# Patient Record
Sex: Male | Born: 1950
Health system: Southern US, Community
[De-identification: ages and names within clinical notes are randomized; demographics above are authoritative.]

## PROBLEM LIST (undated history)

## (undated) DIAGNOSIS — D869 Sarcoidosis, unspecified: Secondary | ICD-10-CM

## (undated) DIAGNOSIS — Z973 Presence of spectacles and contact lenses: Secondary | ICD-10-CM

## (undated) DIAGNOSIS — Z8719 Personal history of other diseases of the digestive system: Secondary | ICD-10-CM

## (undated) DIAGNOSIS — C4491 Basal cell carcinoma of skin, unspecified: Secondary | ICD-10-CM

## (undated) DIAGNOSIS — C61 Malignant neoplasm of prostate: Secondary | ICD-10-CM

## (undated) DIAGNOSIS — J45909 Unspecified asthma, uncomplicated: Secondary | ICD-10-CM

## (undated) DIAGNOSIS — M1990 Unspecified osteoarthritis, unspecified site: Secondary | ICD-10-CM

## (undated) DIAGNOSIS — D229 Melanocytic nevi, unspecified: Secondary | ICD-10-CM

## (undated) DIAGNOSIS — C4492 Squamous cell carcinoma of skin, unspecified: Secondary | ICD-10-CM

## (undated) DIAGNOSIS — I82409 Acute embolism and thrombosis of unspecified deep veins of unspecified lower extremity: Secondary | ICD-10-CM

## (undated) HISTORY — PX: PROSTATE BIOPSY: SHX241

## (undated) HISTORY — DX: Squamous cell carcinoma of skin, unspecified: C44.92

## (undated) HISTORY — DX: Basal cell carcinoma of skin, unspecified: C44.91

## (undated) HISTORY — DX: Melanocytic nevi, unspecified: D22.9

---

## 2001-11-27 ENCOUNTER — Encounter (INDEPENDENT_AMBULATORY_CARE_PROVIDER_SITE_OTHER): Payer: Self-pay | Admitting: Specialist

## 2001-11-27 ENCOUNTER — Ambulatory Visit (HOSPITAL_COMMUNITY): Admission: RE | Admit: 2001-11-27 | Discharge: 2001-11-27 | Payer: Self-pay | Admitting: Gastroenterology

## 2005-05-03 DIAGNOSIS — D229 Melanocytic nevi, unspecified: Secondary | ICD-10-CM

## 2005-05-03 DIAGNOSIS — C4491 Basal cell carcinoma of skin, unspecified: Secondary | ICD-10-CM

## 2005-05-03 HISTORY — DX: Basal cell carcinoma of skin, unspecified: C44.91

## 2005-05-03 HISTORY — DX: Melanocytic nevi, unspecified: D22.9

## 2006-03-13 ENCOUNTER — Ambulatory Visit: Payer: Self-pay | Admitting: Internal Medicine

## 2006-04-01 ENCOUNTER — Ambulatory Visit: Payer: Self-pay | Admitting: Internal Medicine

## 2006-04-09 ENCOUNTER — Ambulatory Visit: Payer: Self-pay | Admitting: Internal Medicine

## 2006-04-09 ENCOUNTER — Encounter (INDEPENDENT_AMBULATORY_CARE_PROVIDER_SITE_OTHER): Payer: Self-pay | Admitting: Specialist

## 2006-04-09 ENCOUNTER — Ambulatory Visit: Admission: RE | Admit: 2006-04-09 | Discharge: 2006-04-09 | Payer: Self-pay | Admitting: Internal Medicine

## 2007-04-02 DIAGNOSIS — T7840XA Allergy, unspecified, initial encounter: Secondary | ICD-10-CM | POA: Insufficient documentation

## 2007-04-02 DIAGNOSIS — J45909 Unspecified asthma, uncomplicated: Secondary | ICD-10-CM | POA: Insufficient documentation

## 2007-05-12 ENCOUNTER — Ambulatory Visit: Payer: Self-pay | Admitting: Internal Medicine

## 2007-05-12 DIAGNOSIS — D869 Sarcoidosis, unspecified: Secondary | ICD-10-CM | POA: Insufficient documentation

## 2007-11-24 ENCOUNTER — Ambulatory Visit: Payer: Self-pay | Admitting: Internal Medicine

## 2008-05-14 HISTORY — PX: OTHER SURGICAL HISTORY: SHX169

## 2008-12-12 DIAGNOSIS — I82409 Acute embolism and thrombosis of unspecified deep veins of unspecified lower extremity: Secondary | ICD-10-CM

## 2008-12-12 HISTORY — DX: Acute embolism and thrombosis of unspecified deep veins of unspecified lower extremity: I82.409

## 2008-12-17 ENCOUNTER — Ambulatory Visit (HOSPITAL_BASED_OUTPATIENT_CLINIC_OR_DEPARTMENT_OTHER): Admission: RE | Admit: 2008-12-17 | Discharge: 2008-12-17 | Payer: Self-pay | Admitting: Orthopedic Surgery

## 2008-12-31 ENCOUNTER — Ambulatory Visit (HOSPITAL_COMMUNITY): Admission: RE | Admit: 2008-12-31 | Discharge: 2008-12-31 | Payer: Self-pay | Admitting: Family Medicine

## 2008-12-31 ENCOUNTER — Inpatient Hospital Stay (HOSPITAL_COMMUNITY): Admission: EM | Admit: 2008-12-31 | Discharge: 2009-01-03 | Payer: Self-pay | Admitting: Emergency Medicine

## 2008-12-31 ENCOUNTER — Encounter (INDEPENDENT_AMBULATORY_CARE_PROVIDER_SITE_OTHER): Payer: Self-pay | Admitting: Family Medicine

## 2008-12-31 ENCOUNTER — Ambulatory Visit: Payer: Self-pay | Admitting: Vascular Surgery

## 2010-06-12 ENCOUNTER — Other Ambulatory Visit: Payer: Self-pay | Admitting: Dermatology

## 2010-08-19 LAB — DIFFERENTIAL
Lymphocytes Relative: 9 % — ABNORMAL LOW (ref 12–46)
Lymphs Abs: 1.2 10*3/uL (ref 0.7–4.0)
Monocytes Relative: 6 % (ref 3–12)
Neutro Abs: 11.7 10*3/uL — ABNORMAL HIGH (ref 1.7–7.7)
Neutrophils Relative %: 85 % — ABNORMAL HIGH (ref 43–77)

## 2010-08-19 LAB — BASIC METABOLIC PANEL
BUN: 23 mg/dL (ref 6–23)
BUN: 24 mg/dL — ABNORMAL HIGH (ref 6–23)
CO2: 26 mEq/L (ref 19–32)
CO2: 29 mEq/L (ref 19–32)
Calcium: 8.9 mg/dL (ref 8.4–10.5)
Chloride: 106 mEq/L (ref 96–112)
Creatinine, Ser: 0.9 mg/dL (ref 0.4–1.5)
Creatinine, Ser: 1.04 mg/dL (ref 0.4–1.5)
GFR calc Af Amer: >60 mL/min (ref >60)
GFR calc non Af Amer: >60 mL/min (ref >60)
Glucose, Bld: 106 mg/dL — ABNORMAL HIGH (ref 70–99)
Potassium: 4.2 mEq/L (ref 3.5–5.1)
Sodium: 142 mEq/L (ref 135–145)

## 2010-08-19 LAB — COMPREHENSIVE METABOLIC PANEL
ALT: 19 U/L (ref 0–53)
Albumin: 3.5 g/dL (ref 3.5–5.2)
Calcium: 9 mg/dL (ref 8.4–10.5)
GFR calc Af Amer: >60 mL/min (ref >60)
Glucose, Bld: 177 mg/dL — ABNORMAL HIGH (ref 70–99)
Sodium: 140 mEq/L (ref 135–145)
Total Protein: 6.3 g/dL (ref 6.0–8.3)

## 2010-08-19 LAB — CBC
HCT: 37.3 % — ABNORMAL LOW (ref 39.0–52.0)
HCT: 38.4 % — ABNORMAL LOW (ref 39.0–52.0)
Hemoglobin: 12.9 g/dL — ABNORMAL LOW (ref 13.0–17.0)
MCHC: 34.3 g/dL (ref 30.0–36.0)
MCHC: 34.5 g/dL (ref 30.0–36.0)
MCHC: 34.7 g/dL (ref 30.0–36.0)
MCV: 95.2 fL (ref 78.0–100.0)
MCV: 95.7 fL (ref 78.0–100.0)
Platelets: 223 10*3/uL (ref 150–400)
Platelets: 238 10*3/uL (ref 150–400)
Platelets: 239 10*3/uL (ref 150–400)
RBC: 4.3 MIL/uL (ref 4.22–5.81)
RDW: 12.9 % (ref 11.5–15.5)
WBC: 10.5 10*3/uL (ref 4.0–10.5)
WBC: 13.7 10*3/uL — ABNORMAL HIGH (ref 4.0–10.5)

## 2010-08-19 LAB — PROTIME-INR
INR: 1 (ref 0.00–1.49)
Prothrombin Time: 13 seconds (ref 11.6–15.2)
Prothrombin Time: 14.1 seconds (ref 11.6–15.2)

## 2010-08-19 LAB — APTT: aPTT: 24 seconds (ref 24–37)

## 2010-09-26 NOTE — H&P (Signed)
[name], [name]                  ACCOUNT NO.:  [id number]   MEDICAL RECORD NO.:  [id number]          PATIENT TYPE:  OUT   LOCATION:  VASC                         FACILITY:  [facility]   PHYSICIAN:  Donalynn Furlong, MD      DATE OF BIRTH:  20-May-1950   DATE OF ADMISSION:  12/31/2008  DATE OF DISCHARGE:                              HISTORY & PHYSICAL   PRIMARY CARE Bennie Chirico:  Dr. Gildardo Cranker.   ADMITTING TEAM:  [facility] red [facility].   CHIEF COMPLAINT:  Right lower extremity pain and swelling.   HISTORY OF PRESENT ILLNESS:  [name] is a 60 year old Caucasian male  who lives in [place] with his wife.  He presented to [facility] ED  tonight for evaluation of his right lower extremity pain and swelling.  He had Jones fracture on the right fifth metatarsal 2 weeks ago.  He  underwent open reduction internal fixation of that fracture with a  screw, by orthopedic surgeon Dr. Jodi Geralds at [facility].  He  was recovering well from the fracture; and he started having swelling  and pain in the right lower extremity; more in the calf area, which  progressed to involve his right thigh also recently.  Because of his  swelling and pain without redness, he decided to visit the orthopedic  surgeon.  He saw Dr. Althea Charon in the orthopedic office; they referred  him to the ER for a Doppler ultrasound.  It shows right lower extremity  DVT in proximal femoral vein, calf vein and popliteal vein.  The patient  denies any other symptoms.  The patient denies any history of clots in  the past.  The patient denies any family history of clots in the past.   PAST MEDICAL HISTORY:  1. Sarcoidosis.  2. Colon polyp.   PAST SURGICAL HISTORY:  None.   SOCIAL HISTORY:  No smoking or drug use; has occasional alcohol use.  He  lives with his wife in [place].   FAMILY HISTORY:  Nothing significant.   HOME MEDICATION LIST:  Symbicort inhaler.   ALLERGIES:  None.   REVIEW OF SYSTEMS:  Positive, as per  HPI, otherwise negative.  Review of  systems done as per 14 system.   PHYSICAL EXAMINATION:  VITAL SIGNS:  Blood pressure 132/77, pulse 94,  respirations 18, temperature 98.2, oxygen saturation 97% on room air.  GENERAL:  Alert, oriented x3; laying in bed without any acute distress.  CARDIOVASCULAR:  S1 and S2 regular.  No murmur, rub or gallop.  LUNGS:  Clear to auscultation bilaterally.  No wheezing, rhonchi,  crackles.  ABDOMEN:  Nontender, nondistended.  Bowel sounds present.  EXTREMITIES:  No clubbing, cyanosis or edema.  Peripheral pulses felt in  all 4 extremities.  NECK:  No thyromegaly or JVD.  SKIN:  No rash or bruits.  NEUROLOGICAL:  Exam shows intact cranial nerves, muscular strength,  sensation and reflexes.  HEENT:  HEAD:  Normocephalic nontraumatic.  EYES:  Pupils equally  reactive to light and accommodation.  Extraocular muscles intact.  Oral  cavity:  Oral mucosa  moist.  No thrush noted.  NECK:  No thyromegaly or JVD.   LABS:  Shows a right lower extremity Doppler reveals DVT proximal  femoral in calf vein and popliteal vein.  BMP unremarkable, except BUN  19, creatinine 24.  PT/PTT, and INR within normal limits.  CBC with  differential shows:  WBC 13.7, otherwise unremarkable.   ASSESSMENT AND PLAN:  1. Right lower extremity deep vein thrombosis in proximal femoral      vein, popliteal vein and calf vein:  Status post immobilization      from recent right fifth metatarsal fracture surgery.  2. History of sarcoidosis.  3. History of colon polyp.   PLAN:  Will admit the patient on a telemetry bed under the [facility] red  team, with a diagnosis of deep vein thrombosis of right lower extremity.  Will check CBC, CMP, PT/PTT, INR in the morning.  Will check EKG now.  Will check vital signs every 8 hours.  Will provide a regular diet.  Will start normal saline at 50 mL per hour for one day.  Will start  Coumadin today as per pharmacy dosing.  The patient has been  counseled  about the use of Coumadin, including side effects and drug monitoring.  The patient will start on Lovenox 1 mg/kg subcutaneously q. 24 hours to  allow therapeutic Coumadin level. Will provide Tylenol, Phenergan,  Ativan and Ambien p.r.n. for symptomatic control.  Further workup  according to the planned pending.      Donalynn Furlong, MD  Electronically Signed     TVP/MEDQ  D:  12/31/2008  T:  12/31/2008  Job:  [id number]   cc:   Harvie Junior, M.D.  Dr. Gildardo Cranker

## 2010-09-26 NOTE — Discharge Summary (Signed)
[name], [name]                  ACCOUNT NO.:  [id number]   MEDICAL RECORD NO.:  [id number]          PATIENT TYPE:  INP   LOCATION:  5508                         FACILITY:  [facility]   PHYSICIAN:  Kela Millin, M.D.DATE OF BIRTH:  November 09, 1950   DATE OF ADMISSION:  12/31/2008  DATE OF DISCHARGE:  01/03/2009                               DISCHARGE SUMMARY   DISCHARGE DIAGNOSES:  1. Right lower extremity deep vein thrombosis.  2. Status post fifth metatarsal fracture surgery - and status post      immobilization.  3. History of sarcoidosis.  4. History of colon polyps.   PROCEDURES AND STUDIES:  Doppler ultrasound of lower extremities -  mobile DVT noted in the proximal femoral vein.  Also a DVT in the calf  vein extending through the popliteal vein.   BRIEF HISTORY:  The patient is a pleasant 60 year old white male who  presented with right lower extremity pain and swelling.  It was noted  that he had had a fracture on the right fifth metatarsal 2 weeks ago and  underwent open reduction internal fixation of that fracture and a screw  was placed by Dr. Jodi Geralds.  He was recovering well from the fracture  but developed swelling and pain in his calf area which progressed to a  to involve his thigh as well, and so he went to see his orthopedic  doctor.  He was seen at their office and was sent to have a Doppler  ultrasound in the ER and the results are as stated above.  Following  these findings, he was admitted for further evaluation and management.   Please see the full admission history and physical dictated on December 31, 2008 by Dr. Allena Katz for the details of the admission physical exam as  well as the laboratory data.   HOSPITAL COURSE:  1. Right leg deep vein thrombosis - upon admission the patient was      started on Lovenox and Coumadin for anticoagulation.  His PT/INR      was monitored daily.  The impression was that his recent metatarsal      surgery with  immobilization following that was what put him at risk      for the DVT and it was also noted that he does have a history of      sarcoidosis.  Given the ultrasound report that he had a mobile clot      in his proximal femoral vein, I discussed the patient with      hematology and they recommended initially monitoring him while he      was getting anticoagulation for at least 24 hours in house and that      if he was reliable and would be compliant with his Lovenox and      Coumadin, to then discharge him following that on the Lovenox and      Coumadin and have him follow up outpatient.  He has remained      hemodynamically stable and the chest pain free.  His hemoglobin has  also remained stable - hemoglobin of 13.4 today prior to discharge.      The patient also received Coumadin and Lovenox teaching and he is      able to administer his own Lovenox shots.  Hematology recommended      for him to limit his activity to a bare minimum until his INR is      therapeutic and then he can slowly increase his activity.  I      discussed this with the patient and his wife and the wife stated      that she will ensure that his activity is limited.  The patient      also agrees to comply with this.  His INR has been monitored daily      and it is still 1.1 today prior to discharge.  He will be given  mg      dose of Coumadin today and will be discharged on 10 mg daily until      his PT/INR is rechecked on January 05, 2009 at 11:45 a.m. in Dr.      Charlott Rakes office.  He has not had any evidence of bleeding.  He will      be discharged at this time and he is to follow up with Dr. Tenny Craw as      already indicated, and once his INR is therapeutic - 2 to 3 goal,      the Lovenox will be discontinued by his primary care physician.      Given that his recent metatarsal surgery was the main risk factor      for his DVT, will recommended anticoagulation for 3-6 months or as      determined per his primary care  physician on followup.  2. Recent fifth metatarsal surgery - the patient was seen by      orthopedics this hospital stay and instructed on his activity level      and he is to follow up at Dr. Luiz Blare' office in 2 weeks.  3. History of sarcoidosis - stable.  He is to continue his Symbicort      and follow up with his pulmonologist as scheduled.   DISCHARGE MEDICATIONS:  1. Lovenox 90 mg subcu q.12 hours until his INR is therapeutic and      then to be discontinued per PCP.  2. Coumadin 10 mg 1 p.o. daily or as directed per his PCP.  3. Symbicort as previously.  4. The patient is to avoid anti-inflammatories/NSAIDS.   FOLLOW UP CARE:  1. Dr. Duane Lope on January 05, 2009 at 11:45 and his office number is      [phone].  2. Dr. Jodi Geralds in 2 weeks.  The patient to call for appointment.   DISCHARGE CONDITION:  Improved and Stable.      Kela Millin, M.D.  Electronically Signed     ACV/MEDQ  D:  01/03/2009  T:  01/03/2009  Job:  [id number]   cc:   C. Duane Lope, M.D.  Harvie Junior, M.D.

## 2010-09-26 NOTE — Op Note (Signed)
NAMEXAI, FRERKING                  ACCOUNT NO.:  [id number]   MEDICAL RECORD NO.:  [id number]          PATIENT TYPE:  AMB   LOCATION:  DSC                          FACILITY:  [facility]   PHYSICIAN:  Harvie Junior, M.D.   DATE OF BIRTH:  1951-01-18   DATE OF PROCEDURE:  12/17/2008  DATE OF DISCHARGE:                               OPERATIVE REPORT   PREOPERATIVE DIAGNOSIS:  True Jones fracture, right fifth metatarsal.   POSTOPERATIVE DIAGNOSIS:  True Jones fracture, right fifth metatarsal.   PRINCIPAL PROCEDURE:  Open reduction and internal fixation of right  fifth metatarsal fracture with a 6.5 cannulated screw, 55 mm with 16-mm  of terminal thread.   SURGEON:  Harvie Junior, MD   ASSISTANT:  Marshia Ly, PA   ANESTHESIA:  General.   BRIEF HISTORY:  Mr. Shedden is a 60 year old male with long history of  having had significant injury to his right foot.  He had been treated  conservatively for a period time.  He had a secondary injury to his foot  and had a much greater pain and because of continued complaints of pain  at that point, he was taken to the operating room for open reduction and  internal fixation of his fifth metatarsal fracture.   PROCEDURE:  The patient was taken to the operating room.  After adequate  anesthesia was obtained with general anesthetic, the patient was placed  supine on the operating table and moved in the left lateral decubitus  position.  All bony prominences were well padded.  Attention was turned  to right foot where initial markings were made under fluoro imaging and  then we put a 4.5 guidewire down the central portion of the canal, over  reamed with a 3.2 drill, took a 4.5 tap down, really did not get  tremendous purchase and at that point, I exchanged 4.5 tap for a 5.0  guidewire and a 5.0 drill distal to the proximal piece, took a 6.5,  tapped down, and got excellent purchase down about 50 mm.  At that  point, we felt that a 55-mm screw would  be great.  We then measured to  see if we get 32 mm of threat past the fracture site, but we were really  could not with a short enough screw.  So, at that point, we went with a  55-mm screw 16-mm thread and over the guidewire advanced down.  Prior to  advancing it under fluoro imaging, we took a curette and curetted the  fracture site aggressively to stimulate bone healing.  Once this was  completed, the 55-mm screw was advanced down under direct imaging under  fluoro.  We got wonderful purchase and we were able to get just dramatic  compression across the fracture site where we were able to see the  fracture site  compressed down.  At that point, the guidewire was removed.  Wound was  irrigated and closed.  Sterile compressive dressing was applied as well  as a posterior splint.  The patient was taken to recovery room and was  noted to be in satisfactory condition.  Estimated blood loss for the  procedure was none.      Harvie Junior, M.D.  Electronically Signed     JLG/MEDQ  D:  12/17/2008  T:  12/17/2008  Job:  [id number]

## 2010-09-29 NOTE — Assessment & Plan Note (Signed)
[facility]                               PULMONARY OFFICE NOTE   NAME:[name], Juan                           MRN:          [id number]  DATE:04/01/2006                            DOB:          [birth year]    HISTORY:  This is a 60 year old white male with new onset of dry cough and  dyspnea that seemed to have improve transiently with albuterol, but was  associated on chest x-ray with significant adenopathy. I saw him on March 13, 2006 with an ACE level of 51 and returns here today for a follow up  plain chest x-ray. In the meantime he has developed significant sweats but  no rigors or purulent sputum, sore throat, chest pain, ocular or articular  complaints or skin rash.   Since his previous visit he was started on Symbicort 80-4.5 two puffs b.i.d.  and had complete elimination of all of his symptoms and is no longer  requiring any albuterol.   PHYSICAL EXAMINATION:  GENERAL: He is a pleasant, ambulatory white man in no  acute distress.  VITAL SIGNS:  Afebrile.  HEENT: Unremarkable. Pharynx is clear.  NECK: Supple without cervical adenopathy or tenderness. Trachea is midline.  LUNG FIELDS: Are perfectly clear bilaterally to auscultation and percussion.  HEART: Regular rhythm without murmurs, rubs, or gallops.  ABDOMEN: Soft, benign.  EXTREMITIES:  Warm without calf tenderness.   Chest x-ray reveals massive mediastinal hilar adenopathy.   IMPRESSION:  Marked improvement in symptoms of coughing and dyspnea after  Symbicort 80-4.5 two puffs b.i.d. was added. This of course does not  indicate a diagnosis as many different forms of airways reactivity would  improve with Symbicort, including related to sarcoid and possibly  lymphomatous inflammation of the airway.  I am encouraged though based on  his benign clinical course, that sarcoid appears much more likely than  lymphoma but am concerned based on the size of the lymph nodes.   I therefore  am proceeding directly to bronchoscopy with transbronchial  bronchoscopy and he agreed to the procedure after full discussion of risks,  benefits and alternatives.     Charlaine Dalton. Sherene Sires, MD, [facility]  Electronically Signed    MBW/MedQ  DD: 04/01/2006  DT: 04/01/2006  Job #: [id number]   cc:   Dellis Anes. Idell Pickles, M.D.

## 2010-09-29 NOTE — Assessment & Plan Note (Signed)
[facility]                               PULMONARY OFFICE NOTE   KASHMERE, STAFFA                           MRN:          [id number]  DATE:03/13/2006                            DOB:          1950-09-06    REFERRING PHYSICIAN:  Dellis Anes. Idell Pickles, M.D.   PULMONARY CONSULTATION:   CHIEF COMPLAINT:  Dyspnea and cough.   HISTORY:  A 60 year old white male with a history of seasonal rhinitis going  back to his teenage years, typically worse in the fall than the spring  associated with mostly nasal congestion, sneezing and watery rhinitis with  minimal need for albuterol over the last several years. However, in the last  6 weeks he has had a different pattern of cough associated with dyspnea that  initially occurred while he was in [place] on work and has persisted on a  daily basis since then.  He says he states he is about 50% better after  using albuterol and actually comes to the office today after having used  albuterol 3 hours ago with good control of his coughing.  He says he has  no nocturnal exacerbation to coughing, fever, chills, sweats, orthopnea,  PND, leg swelling, itching, sneezing or subjective wheezing at present.   PAST MEDICAL HISTORY:  Is significant for the absence of additional  complaints.   Allergies none known.   MEDICATIONS:  Allegra D one daily as needed.   SOCIAL HISTORY:  He never smoked.  He works as a Fish farm manager, travels  extensively.   FAMILY HISTORY:  Is recorded in detail, significant for the absence of HB,  sarcoid or lymphoma.   REVIEW OF SYSTEMS:  Taken in detail on the worksheet.  Significant for  absence of weight loss, fever, chills, sweats, ocular complaints.  He has  occasional ankle discomfort but can control this with nonsteroidals as  needed.   PHYSICAL EXAMINATION:  This is a pleasant ambulatory white male in no acute  distress.  VITAL SIGNS:  Normal.  HEENT:  Unremarkable.  Pharynx is clear.  LUNGS:  Lung fields are completely clear by auscultation and percussion.  HEART:  There is a regular rate and rhythm without murmur, gallop or rub.  ABDOMEN:  Soft, benign.  EXTREMITIES:  Warm without calf tenderness, cyanosis, clubbing or edema.   CT scan was available from [facility] and indicates extensive mediastinal  hilar adenopathy.   IMPRESSION:  1. His new onset cough and dyspnea suggesting possible asthma since he      responds fairly well to Albuterol.  Because he is using so much      albuterol I will switch him to Symbicort 80/4.5 two puffs b.i.d.  2. A mediastinal hilar adenopathy of unclear etiology and also unclear      chronicity.  Apparently there are old x-rays available that do not show      this and so what I would like to do is to have the patient return in 2      weeks to have a follow up chest x-ray so we can  put all the cards on      the table in terms of developing chronicity of present illness      radiographically.  If indeed there is new adenopathy, the differential      diagnosis would be most likely sarcoid but unfortunately would include      a lymphoma given the relatively late onset in age of this patient and      non-African-American status.  3. Today I recommended a profile looking for any evidence of systemic      sarcoid activity which would include a CMET for LFTs and also sed rate      and ACE levels.    ______________________________  Charlaine Dalton Sherene Sires, MD, [facility]    MBW/MedQ  DD: 03/13/2006  DT: 03/14/2006  Job #: [id number]   cc:   Dellis Anes. Idell Pickles, M.D.

## 2010-09-29 NOTE — Op Note (Signed)
[name], Juan                  ACCOUNT NO.:  [id number]   MEDICAL RECORD NO.:  [id number]          PATIENT TYPE:  AMB   LOCATION:  CARD                         FACILITY:  [facility]   PHYSICIAN:  Casimiro Needle B. Sherene Sires, MD, FCCPDATE OF BIRTH:  Jul 17, 1950   DATE OF PROCEDURE:  04/09/2006  DATE OF DISCHARGE:                               OPERATIVE REPORT   PROCEDURE:  Fiberoptic bronchoscopy with a transbronchial biopsy of the  right lower lobe.   HISTORY AND INDICATIONS:  Please see dictated pulmonary consultation  notes in the E-Chart record.  The patient agreed to the procedure after  a full discussion of the risks, benefits and alternatives.   The procedure was performed in the bronchoscopy suite with continuous  monitoring by surface ECG and oximetry.  The patient maintained adequate  saturation throughout the procedure in sinus rhythm.  He received a  total of 100 mg of IV Demerol and 5 mg IV Versed for adequate sedation  and cough suppression.   The right and left nares were initially prepared with 1% lidocaine by  updraft nebulizer and then the right left naris was additionally  prepared with 2% lidocaine jelly.   Using a standard flexible fiberoptic bronchoscope, the left naris was  easily cannulated with good visualization of the entire pharynx and  larynx.  The cords moved normally and there were no apparent upper  airway lesions.   Using additional 1% lidocaine as needed, the entire tracheobronchial  tree was explored bilaterally with the following findings:  The trachea,  carina and all the major airways opened widely to the subsegmental  level.  There were no endobronchial lesions, and the mucosa appeared  normal throughout.   PROCEDURE:  Using a wedge position in several of the basal segments of  the right lower lobe, random transbronchial biopsies were obtained x6  for transbronchial for histology using fluoroscopic guidance.  In  addition, the right middle lobe was  lavaged for AFB and fungal stain and  culture.   The patient tolerated the procedure well with a follow-up chest x-ray  pending at the time of dictation.   IMPRESSION:  Extensive mediastinal and hilar adenopathy consistent with  sarcoid versus lymphoma.  There should be adequate tissue obtained by  transbronchial biopsies to exclude sarcoid, with the next logical step  in the workup being proceeding with mediastinoscopy if the diagnosis is  still in doubt after the above studies are available.      Charlaine Dalton. Sherene Sires, MD, [facility]  Electronically Signed    MBW/MEDQ  D:  04/09/2006  T:  04/09/2006  Job:  [id number]   cc:   Dellis Anes. Idell Pickles, M.D.  Fax: [phone]

## 2013-05-29 NOTE — Progress Notes (Signed)
Surgery scheduled for 06/24/13.  preop on 06/04/13 at 0830am.  Need orders in EPIC.  Thank You.

## 2013-06-02 ENCOUNTER — Other Ambulatory Visit: Payer: Self-pay | Admitting: Orthopedic Surgery

## 2013-06-02 ENCOUNTER — Encounter (HOSPITAL_COMMUNITY): Payer: Self-pay | Admitting: Pharmacy Technician

## 2013-06-03 ENCOUNTER — Other Ambulatory Visit (HOSPITAL_COMMUNITY): Payer: Self-pay | Admitting: Orthopedic Surgery

## 2013-06-03 NOTE — Patient Instructions (Signed)
[place]  06/03/2013   Your procedure is scheduled on:  06/24/13  [facility]  Report to [place] at 1030      AM.  Call this number if you have problems the morning of surgery: [phone]       Remember:   Do not eat food:After Midnight. Tuesday NIGHT-May have CLEAR LIQUIDS Wednesday MORNING UNTIL 0700 AM- THEN NOTHING BY MOUTH   Take these medicines the morning of surgery with A SIP OF WATER:none   .  Contacts, dentures or partial plates can not be worn to surgery  Leave suitcase in the car. After surgery it may be brought to your room.  For patients admitted to the hospital, checkout time is 11:00 AM day of  discharge.             SPECIAL INSTRUCTIONS- SEE [place] PREPARING FOR SURGERY INSTRUCTION SHEET-     DO NOT WEAR JEWELRY, LOTIONS, POWDERS, OR PERFUMES.  WOMEN-- DO NOT SHAVE LEGS OR UNDERARMS FOR 12 HOURS BEFORE SHOWERS. MEN MAY SHAVE FACE.  Patients discharged the day of surgery will not be allowed to drive home. IF going home the day of surgery, you must have a driver and someone to stay with you for the first 24 hours  Name and phone number of your driver:                                                                        Please read over the following fact sheets that you were given:  [place] PST [phone]  [phone]                 [place]                                                  Patient Signature _____________________________

## 2013-06-04 ENCOUNTER — Other Ambulatory Visit: Payer: Self-pay

## 2013-06-04 ENCOUNTER — Encounter (HOSPITAL_COMMUNITY): Payer: Self-pay

## 2013-06-04 ENCOUNTER — Encounter (HOSPITAL_COMMUNITY)
Admission: RE | Admit: 2013-06-04 | Discharge: 2013-06-04 | Disposition: A | Discharge status: Home / Self Care | Payer: BC Managed Care – PPO | Source: Ambulatory Visit | Attending: Orthopedic Surgery | Admitting: Orthopedic Surgery

## 2013-06-04 ENCOUNTER — Ambulatory Visit (HOSPITAL_COMMUNITY)
Admission: RE | Admit: 2013-06-04 | Discharge: 2013-06-04 | Disposition: A | Discharge status: Home / Self Care | Payer: BC Managed Care – PPO | Source: Ambulatory Visit | Attending: Orthopedic Surgery | Admitting: Orthopedic Surgery

## 2013-06-04 DIAGNOSIS — Z01812 Encounter for preprocedural laboratory examination: Secondary | ICD-10-CM | POA: Insufficient documentation

## 2013-06-04 DIAGNOSIS — Z01810 Encounter for preprocedural cardiovascular examination: Secondary | ICD-10-CM | POA: Insufficient documentation

## 2013-06-04 DIAGNOSIS — Z01818 Encounter for other preprocedural examination: Secondary | ICD-10-CM | POA: Insufficient documentation

## 2013-06-04 HISTORY — DX: Unspecified asthma, uncomplicated: J45.909

## 2013-06-04 HISTORY — DX: Sarcoidosis, unspecified: D86.9

## 2013-06-04 HISTORY — DX: Acute embolism and thrombosis of unspecified deep veins of unspecified lower extremity: I82.409

## 2013-06-04 HISTORY — DX: Unspecified osteoarthritis, unspecified site: M19.90

## 2013-06-04 LAB — CBC
HEMATOCRIT: 40.6 % (ref 39.0–52.0)
Hemoglobin: 14.2 g/dL (ref 13.0–17.0)
MCH: 32 pg (ref 26.0–34.0)
MCHC: 35 g/dL (ref 30.0–36.0)
MCV: 91.4 fL (ref 78.0–100.0)
Platelets: 280 10*3/uL (ref 150–400)
RBC: 4.44 MIL/uL (ref 4.22–5.81)
RDW: 12.6 % (ref 11.5–15.5)
WBC: 7 10*3/uL (ref 4.0–10.5)

## 2013-06-15 NOTE — Progress Notes (Signed)
Pt wishes to take allegra d am of surgery, pt instructed ok to take allegra d am of surgery with clear liquids

## 2013-06-23 DIAGNOSIS — S83249A Other tear of medial meniscus, current injury, unspecified knee, initial encounter: Secondary | ICD-10-CM | POA: Diagnosis present

## 2013-06-23 NOTE — H&P (Signed)
  CC- [name] is a 63 y.o. male who presents with right knee pain.  HPI- . Knee Pain: Patient presents with knee pain involving the  right knee. Onset of the symptoms was several months ago. Inciting event: none known. Current symptoms include giving out, pain located medially and stiffness. Pain is aggravated by lateral movements, rising after sitting, squatting and walking.  Patient has had no prior knee problems. Evaluation to date: MRI: abnormal medial meniscal tear. Treatment to date: corticosteroid injection which was not very effective.  Past Medical History  Diagnosis Date  . Asthma     occasional allergy related  . Sarcoidosis     no current pulmonary md, followed by dr Melinda Crutch for  . Arthritis     oa  . DVT of lower limb, acute aug 2010    right    Past Surgical History  Procedure Laterality Date  . 5th metatarsal repair Right 2010    Prior to Admission medications   Medication Sig Start Date End Date Taking? Authorizing Provider  aspirin EC 81 MG tablet Take 81 mg by mouth daily.    Historical Provider, MD  Calcium-Magnesium-Vitamin D (CALCIUM MAGNESIUM PO) Take 1 tablet by mouth daily.    Historical Provider, MD  Cinnamon 500 MG TABS Take 1 tablet by mouth daily.    Historical Provider, MD  Coenzyme Q10 (CO Q 10) 100 MG CAPS Take 1 capsule by mouth daily.    Historical Provider, MD  fexofenadine-pseudoephedrine (ALLEGRA-D) 60-120 MG per tablet Take 1 tablet by mouth daily.     Historical Provider, MD  glucosamine-chondroitin 500-400 MG tablet Take 1 tablet by mouth daily.    Historical Provider, MD  meloxicam (MOBIC) 15 MG tablet Take 15 mg by mouth daily.    Historical Provider, MD  Multiple Vitamin (MULTIVITAMIN WITH MINERALS) TABS tablet Take 1 tablet by mouth daily.    Historical Provider, MD  omega-3 acid ethyl esters (LOVAZA) 1 G capsule Take 1 g by mouth daily.    Historical Provider, MD   KNEE EXAM antalgic gait, soft tissue tenderness over medial joint  line, effusion, negative pivot-shift, collateral ligaments intact  Physical Examination: General appearance - alert, well appearing, and in no distress Mental status - alert, oriented to person, place, and time Chest - clear to auscultation, no wheezes, rales or rhonchi, symmetric air entry Heart - normal rate, regular rhythm, normal S1, S2, no murmurs, rubs, clicks or gallops Abdomen - soft, nontender, nondistended, no masses or organomegaly Neurological - alert, oriented, normal speech, no focal findings or movement disorder noted   Asessment/Plan--- Right knee medial meniscal tear- - Plan left knee arthroscopy with meniscal debridement. Procedure risks and potential comps discussed with patient who elects to proceed. Goals are decreased pain and increased function with a high likelihood of achieving both

## 2013-06-24 ENCOUNTER — Encounter (HOSPITAL_COMMUNITY)
Admission: RE | Disposition: A | Discharge status: Home / Self Care | Payer: Self-pay | Source: Ambulatory Visit | Attending: Orthopedic Surgery

## 2013-06-24 ENCOUNTER — Ambulatory Visit (HOSPITAL_COMMUNITY): Payer: BC Managed Care – PPO | Admitting: Anesthesiology

## 2013-06-24 ENCOUNTER — Ambulatory Visit (HOSPITAL_COMMUNITY)
Admission: RE | Admit: 2013-06-24 | Discharge: 2013-06-24 | Disposition: A | Discharge status: Home / Self Care | Payer: BC Managed Care – PPO | Source: Ambulatory Visit | Attending: Orthopedic Surgery | Admitting: Orthopedic Surgery

## 2013-06-24 ENCOUNTER — Encounter (HOSPITAL_COMMUNITY): Payer: Self-pay | Admitting: *Deleted

## 2013-06-24 ENCOUNTER — Encounter (HOSPITAL_COMMUNITY): Payer: BC Managed Care – PPO | Admitting: Anesthesiology

## 2013-06-24 DIAGNOSIS — Z86718 Personal history of other venous thrombosis and embolism: Secondary | ICD-10-CM | POA: Insufficient documentation

## 2013-06-24 DIAGNOSIS — M23329 Other meniscus derangements, posterior horn of medial meniscus, unspecified knee: Secondary | ICD-10-CM | POA: Insufficient documentation

## 2013-06-24 DIAGNOSIS — D869 Sarcoidosis, unspecified: Secondary | ICD-10-CM | POA: Insufficient documentation

## 2013-06-24 DIAGNOSIS — Z79899 Other long term (current) drug therapy: Secondary | ICD-10-CM | POA: Insufficient documentation

## 2013-06-24 DIAGNOSIS — Z7982 Long term (current) use of aspirin: Secondary | ICD-10-CM | POA: Insufficient documentation

## 2013-06-24 DIAGNOSIS — S83249A Other tear of medial meniscus, current injury, unspecified knee, initial encounter: Secondary | ICD-10-CM | POA: Diagnosis present

## 2013-06-24 HISTORY — PX: KNEE ARTHROSCOPY: SHX127

## 2013-06-24 SURGERY — ARTHROSCOPY, KNEE
Anesthesia: General | Site: Knee | Laterality: Right

## 2013-06-24 MED ORDER — DEXAMETHASONE SODIUM PHOSPHATE 10 MG/ML IJ SOLN
INTRAMUSCULAR | Status: AC
  Filled 2013-06-24: qty 1

## 2013-06-24 MED ORDER — FENTANYL CITRATE 0.05 MG/ML IJ SOLN
INTRAMUSCULAR | Status: DC | PRN
  Administered 2013-06-24: 100 ug via INTRAVENOUS
  Administered 2013-06-24: 50 ug via INTRAVENOUS
  Administered 2013-06-24: 100 ug via INTRAVENOUS
  Administered 2013-06-24: 50 ug via INTRAVENOUS

## 2013-06-24 MED ORDER — PROMETHAZINE HCL 25 MG/ML IJ SOLN: 6.2500 mg | INTRAMUSCULAR | Status: DC | PRN

## 2013-06-24 MED ORDER — HYDROCODONE-ACETAMINOPHEN 5-325 MG PO TABS: 1.0000 | ORAL_TABLET | Freq: Four times a day (QID) | ORAL | Status: DC | PRN

## 2013-06-24 MED ORDER — PROPOFOL 10 MG/ML IV BOLUS
INTRAVENOUS | Status: DC | PRN
  Administered 2013-06-24: 200 mg via INTRAVENOUS

## 2013-06-24 MED ORDER — ONDANSETRON HCL 4 MG/2ML IJ SOLN
INTRAMUSCULAR | Status: AC
  Filled 2013-06-24: qty 2

## 2013-06-24 MED ORDER — BUPIVACAINE-EPINEPHRINE 0.25% -1:200000 IJ SOLN
INTRAMUSCULAR | Status: DC | PRN
  Administered 2013-06-24: 20 mL

## 2013-06-24 MED ORDER — LIDOCAINE HCL 1 % IJ SOLN
INTRAMUSCULAR | Status: DC | PRN
  Administered 2013-06-24: 100 mg via INTRADERMAL

## 2013-06-24 MED ORDER — MIDAZOLAM HCL 2 MG/2ML IJ SOLN
INTRAMUSCULAR | Status: AC
  Filled 2013-06-24: qty 2

## 2013-06-24 MED ORDER — LACTATED RINGERS IV SOLN
INTRAVENOUS | Status: DC | PRN
  Administered 2013-06-24 (×2): via INTRAVENOUS

## 2013-06-24 MED ORDER — ACETAMINOPHEN 10 MG/ML IV SOLN
1000.0000 mg | Freq: Once | INTRAVENOUS | Status: AC
  Administered 2013-06-24: 1000 mg via INTRAVENOUS
  Filled 2013-06-24: qty 100

## 2013-06-24 MED ORDER — METHOCARBAMOL 500 MG PO TABS: 500.0000 mg | ORAL_TABLET | Freq: Four times a day (QID) | ORAL | Status: DC

## 2013-06-24 MED ORDER — LIDOCAINE HCL (CARDIAC) 20 MG/ML IV SOLN
INTRAVENOUS | Status: AC
  Filled 2013-06-24: qty 5

## 2013-06-24 MED ORDER — FENTANYL CITRATE 0.05 MG/ML IJ SOLN
INTRAMUSCULAR | Status: AC
  Filled 2013-06-24: qty 2

## 2013-06-24 MED ORDER — SODIUM CHLORIDE 0.9 % IV SOLN: INTRAVENOUS | Status: DC

## 2013-06-24 MED ORDER — CEFAZOLIN SODIUM-DEXTROSE 2-3 GM-% IV SOLR
INTRAVENOUS | Status: AC
  Filled 2013-06-24: qty 50

## 2013-06-24 MED ORDER — CEFAZOLIN SODIUM-DEXTROSE 2-3 GM-% IV SOLR
INTRAVENOUS | Status: DC | PRN
  Administered 2013-06-24: 2 g via INTRAVENOUS

## 2013-06-24 MED ORDER — RIVAROXABAN 10 MG PO TABS: 10.0000 mg | ORAL_TABLET | Freq: Every day | ORAL | Status: DC

## 2013-06-24 MED ORDER — ONDANSETRON HCL 4 MG/2ML IJ SOLN
INTRAMUSCULAR | Status: DC | PRN
  Administered 2013-06-24: 4 mg via INTRAVENOUS

## 2013-06-24 MED ORDER — KETOROLAC TROMETHAMINE 30 MG/ML IJ SOLN
INTRAMUSCULAR | Status: AC
  Filled 2013-06-24: qty 1

## 2013-06-24 MED ORDER — MIDAZOLAM HCL 5 MG/5ML IJ SOLN
INTRAMUSCULAR | Status: DC | PRN
  Administered 2013-06-24: 2 mg via INTRAVENOUS

## 2013-06-24 MED ORDER — HYDROCODONE-ACETAMINOPHEN 5-325 MG PO TABS
1.0000 | ORAL_TABLET | Freq: Once | ORAL | Status: AC
  Administered 2013-06-24: 1 via ORAL
  Filled 2013-06-24: qty 1

## 2013-06-24 MED ORDER — FENTANYL CITRATE 0.05 MG/ML IJ SOLN: 25.0000 ug | INTRAMUSCULAR | Status: DC | PRN

## 2013-06-24 MED ORDER — SODIUM CHLORIDE 0.9 % IR SOLN
Status: DC | PRN
  Administered 2013-06-24: 9000 mL

## 2013-06-24 MED ORDER — BUPIVACAINE-EPINEPHRINE PF 0.25-1:200000 % IJ SOLN
INTRAMUSCULAR | Status: AC
  Filled 2013-06-24: qty 30

## 2013-06-24 MED ORDER — DEXAMETHASONE SODIUM PHOSPHATE 10 MG/ML IJ SOLN
10.0000 mg | Freq: Once | INTRAMUSCULAR | Status: AC
  Administered 2013-06-24: 10 mg via INTRAVENOUS

## 2013-06-24 MED ORDER — KETOROLAC TROMETHAMINE 30 MG/ML IJ SOLN
15.0000 mg | Freq: Once | INTRAMUSCULAR | Status: AC | PRN
  Administered 2013-06-24: 30 mg via INTRAVENOUS

## 2013-06-24 SURGICAL SUPPLY — 26 items
BANDAGE ELASTIC 6 VELCRO ST LF (GAUZE/BANDAGES/DRESSINGS) ×1 IMPLANT
BLADE 4.2CUDA (BLADE) ×2 IMPLANT
CLOTH BEACON ORANGE TIMEOUT ST (SAFETY) ×2 IMPLANT
COUNTER NEEDLE 20 DBL MAG RED (NEEDLE) ×2 IMPLANT
CUFF TOURN SGL QUICK 34 (TOURNIQUET CUFF) ×2
CUFF TRNQT CYL 34X4X40X1 (TOURNIQUET CUFF) ×1 IMPLANT
DRAPE U-SHAPE 47X51 STRL (DRAPES) ×2 IMPLANT
DRSG EMULSION OIL 3X3 NADH (GAUZE/BANDAGES/DRESSINGS) ×2 IMPLANT
DRSG PAD ABDOMINAL 8X10 ST (GAUZE/BANDAGES/DRESSINGS) ×1 IMPLANT
DURAPREP 26ML APPLICATOR (WOUND CARE) ×2 IMPLANT
GLOVE BIO SURGEON STRL SZ8 (GLOVE) ×2 IMPLANT
GLOVE BIOGEL PI IND STRL 8 (GLOVE) ×1 IMPLANT
GLOVE BIOGEL PI INDICATOR 8 (GLOVE) ×1
GOWN STRL REUS W/TWL LRG LVL3 (GOWN DISPOSABLE) ×2 IMPLANT
MANIFOLD NEPTUNE II (INSTRUMENTS) ×2 IMPLANT
PACK ARTHROSCOPY WL (CUSTOM PROCEDURE TRAY) ×2 IMPLANT
PACK ICE MAXI GEL EZY WRAP (MISCELLANEOUS) ×6 IMPLANT
PADDING CAST COTTON 6X4 STRL (CAST SUPPLIES) ×3 IMPLANT
POSITIONER SURGICAL ARM (MISCELLANEOUS) ×2 IMPLANT
SET ARTHROSCOPY TUBING (MISCELLANEOUS) ×2
SET ARTHROSCOPY TUBING LN (MISCELLANEOUS) ×1 IMPLANT
SPONGE GAUZE 4X4 12PLY (GAUZE/BANDAGES/DRESSINGS) ×1 IMPLANT
SUT ETHILON 4 0 PS 2 18 (SUTURE) ×2 IMPLANT
TOWEL OR 17X26 10 PK STRL BLUE (TOWEL DISPOSABLE) ×2 IMPLANT
WAND 90 DEG TURBOVAC W/CORD (SURGICAL WAND) IMPLANT
WRAP KNEE MAXI GEL POST OP (GAUZE/BANDAGES/DRESSINGS) ×2 IMPLANT

## 2013-06-24 NOTE — Interval H&P Note (Signed)
History and Physical Interval Note:  06/24/2013 12:38 PM  [name]  has presented today for surgery, with the diagnosis of RIGHT KNEE MEDIAL MENISCAL TEAR   The various methods of treatment have been discussed with the patient and family. After consideration of risks, benefits and other options for treatment, the patient has consented to  Procedure(s): RIGHT KNEE ARTHROSCOPY WITH DEBRIDEMENT  (Right) as a surgical intervention .  The patient's history has been reviewed, patient examined, no change in status, stable for surgery.  I have reviewed the patient's chart and labs.  Questions were answered to the patient's satisfaction.     Gearlean Alf

## 2013-06-24 NOTE — Transfer of Care (Signed)
Immediate Anesthesia Transfer of Care Note  Patient: [name]  Procedure(s) Performed: Procedure(s): RIGHT KNEE ARTHROSCOPY WITH DEBRIDEMENT  (Right)  Patient Location: PACU  Anesthesia Type:General  Level of Consciousness: awake, alert , oriented and patient cooperative  Airway & Oxygen Therapy: Patient Spontanous Breathing and Patient connected to face mask oxygen  Post-op Assessment: Report given to PACU RN, Post -op Vital signs reviewed and stable and Patient moving all extremities  Post vital signs: Reviewed and stable  Complications: No apparent anesthesia complications

## 2013-06-24 NOTE — Anesthesia Postprocedure Evaluation (Signed)
  Anesthesia Post-op Note  Patient: [name]  Procedure(s) Performed: Procedure(s) (LRB): RIGHT KNEE ARTHROSCOPY WITH DEBRIDEMENT  (Right)  Patient Location: PACU  Anesthesia Type: General  Level of Consciousness: awake and alert   Airway and Oxygen Therapy: Patient Spontanous Breathing  Post-op Pain: mild  Post-op Assessment: Post-op Vital signs reviewed, Patient's Cardiovascular Status Stable, Respiratory Function Stable, Patent Airway and No signs of Nausea or vomiting  Last Vitals:  Filed Vitals:   06/24/13 1407  BP: 144/89  Pulse:   Temp:   Resp:     Post-op Vital Signs: stable   Complications: No apparent anesthesia complications

## 2013-06-24 NOTE — Anesthesia Preprocedure Evaluation (Addendum)
Anesthesia Evaluation  Patient identified by MRN, date of birth, ID band Patient awake    Reviewed: Allergy & Precautions, H&P , NPO status , Patient's Chart, lab work & pertinent test results  Airway Mallampati: II TM Distance: >3 FB Neck ROM: Full    Dental no notable dental hx.    Pulmonary neg pulmonary ROS,  breath sounds clear to auscultation  Pulmonary exam normal       Cardiovascular DVT negative cardio ROS  Rhythm:Regular Rate:Normal     Neuro/Psych negative neurological ROS  negative psych ROS   GI/Hepatic negative GI ROS, Neg liver ROS,   Endo/Other  negative endocrine ROS  Renal/GU negative Renal ROS  negative genitourinary   Musculoskeletal negative musculoskeletal ROS (+)   Abdominal   Peds negative pediatric ROS (+)  Hematology negative hematology ROS (+)   Anesthesia Other Findings   Reproductive/Obstetrics negative OB ROS                          Anesthesia Physical Anesthesia Plan  ASA: II  Anesthesia Plan: General   Post-op Pain Management:    Induction: Intravenous  Airway Management Planned: LMA  Additional Equipment:   Intra-op Plan:   Post-operative Plan:   Informed Consent: I have reviewed the patients History and Physical, chart, labs and discussed the procedure including the risks, benefits and alternatives for the proposed anesthesia with the patient or authorized representative who has indicated his/her understanding and acceptance.   Dental advisory given  Plan Discussed with: CRNA and Surgeon  Anesthesia Plan Comments:         Anesthesia Quick Evaluation

## 2013-06-24 NOTE — Discharge Instructions (Signed)

## 2013-06-24 NOTE — Op Note (Signed)
Preoperative diagnosis-  Right knee medial meniscal tear  Postoperative diagnosis Right- knee medial meniscal tear   plus Right medial femoral chondral defect  Procedure- Right knee arthroscopy with medial meniscal debridement and chondroplasty   Surgeon- Dione Plover. Eloyse Causey, MD  Anesthesia-General  EBL-  minimal Complications- None  Condition- PACU - hemodynamically stable.  Brief clinical note- -[name] is a 63 y.o.  male with a several month history of right knee pain and mechanical symptoms. Exam and history suggested medial meniscal tear confirmed by MRI. The patient presents now for arthroscopy and debridement   Procedure in detail -       After successful administration of General anesthetic, a tourmiquet is placed high on the Right  thigh and the Right lower extremity is prepped and draped in the usual sterile fashion. Time out is performed by the surgical team. Standard superomedial and inferolateral portal sites are marked and incisions made with an 11 blade. The inflow cannula is passed through the superomedial portal and camera through the inferolateral portal and inflow is initiated. Arthroscopic visualization proceeds.      The undersurface of the patella and trochlea are visualized and there are Grade IV changes throughout the entire patellofemoral joint. The medial and lateral gutters are visualized and there are  no loose bodies. Flexion and valgus force is applied to the knee and the medial compartment is entered. A spinal needle is passed into the joint through the site marked for the inferomedial portal. A small incision is made and the dilator passed into the joint. The findings for the medial compartment are unstable tear posterior horn medial meniscus with approximately 1 x 2 cm area unstable cartilage medial femoral condyle . The tear is debrided to a stable base with baskets and a shaver and sealed off with the Arthrocare. The shaver is used to debride the unstable  cartilage to a stable cartilaginous base with stable edges. It is probed and found to be stable.    The intercondylar notch is visualized and the ACL appears normal. The lateral compartment is entered and the findings are normal .       The joint is again inspected and there are no other tears, defects or loose bodies identified. The arthroscopic equipment is then removed from the inferior portals which are closed with interrupted 4-0 nylon. 20 ml of .25% Marcaine with epinephrine are injected through the inflow cannula and the cannula is then removed and the portal closed with nylon. The incisions are cleaned and dried and a bulky sterile dressing is applied. The patient is then awakened and transported to recovery in stable condition.   06/24/2013, 2:00 PM

## 2013-06-25 ENCOUNTER — Encounter (HOSPITAL_COMMUNITY): Payer: Self-pay | Admitting: Orthopedic Surgery

## 2013-07-02 ENCOUNTER — Other Ambulatory Visit: Payer: Self-pay | Admitting: Orthopedic Surgery

## 2014-11-08 ENCOUNTER — Other Ambulatory Visit: Payer: Self-pay

## 2015-07-28 DIAGNOSIS — M1711 Unilateral primary osteoarthritis, right knee: Secondary | ICD-10-CM | POA: Diagnosis not present

## 2015-09-02 DIAGNOSIS — M1711 Unilateral primary osteoarthritis, right knee: Secondary | ICD-10-CM | POA: Diagnosis not present

## 2015-09-09 DIAGNOSIS — M1711 Unilateral primary osteoarthritis, right knee: Secondary | ICD-10-CM | POA: Diagnosis not present

## 2015-09-16 DIAGNOSIS — M1711 Unilateral primary osteoarthritis, right knee: Secondary | ICD-10-CM | POA: Diagnosis not present

## 2015-10-03 DIAGNOSIS — M1711 Unilateral primary osteoarthritis, right knee: Secondary | ICD-10-CM | POA: Diagnosis not present

## 2015-10-05 DIAGNOSIS — M25511 Pain in right shoulder: Secondary | ICD-10-CM | POA: Diagnosis not present

## 2015-10-05 DIAGNOSIS — G8929 Other chronic pain: Secondary | ICD-10-CM | POA: Diagnosis not present

## 2015-10-28 DIAGNOSIS — G479 Sleep disorder, unspecified: Secondary | ICD-10-CM | POA: Diagnosis not present

## 2015-10-28 DIAGNOSIS — G4725 Circadian rhythm sleep disorder, jet lag type: Secondary | ICD-10-CM | POA: Diagnosis not present

## 2015-11-02 DIAGNOSIS — M25511 Pain in right shoulder: Secondary | ICD-10-CM | POA: Diagnosis not present

## 2015-11-28 DIAGNOSIS — M25511 Pain in right shoulder: Secondary | ICD-10-CM | POA: Diagnosis not present

## 2015-11-28 DIAGNOSIS — G8929 Other chronic pain: Secondary | ICD-10-CM | POA: Diagnosis not present

## 2015-12-12 DIAGNOSIS — M12811 Other specific arthropathies, not elsewhere classified, right shoulder: Secondary | ICD-10-CM | POA: Diagnosis not present

## 2015-12-15 ENCOUNTER — Other Ambulatory Visit: Payer: Self-pay | Admitting: Dermatology

## 2015-12-15 DIAGNOSIS — C44722 Squamous cell carcinoma of skin of right lower limb, including hip: Secondary | ICD-10-CM | POA: Diagnosis not present

## 2015-12-15 DIAGNOSIS — C4492 Squamous cell carcinoma of skin, unspecified: Secondary | ICD-10-CM

## 2015-12-15 DIAGNOSIS — L570 Actinic keratosis: Secondary | ICD-10-CM | POA: Diagnosis not present

## 2015-12-15 HISTORY — DX: Squamous cell carcinoma of skin, unspecified: C44.92

## 2015-12-19 DIAGNOSIS — M12811 Other specific arthropathies, not elsewhere classified, right shoulder: Secondary | ICD-10-CM | POA: Diagnosis not present

## 2016-04-12 DIAGNOSIS — Z23 Encounter for immunization: Secondary | ICD-10-CM | POA: Diagnosis not present

## 2016-04-12 DIAGNOSIS — E7800 Pure hypercholesterolemia, unspecified: Secondary | ICD-10-CM | POA: Diagnosis not present

## 2016-04-12 DIAGNOSIS — Z125 Encounter for screening for malignant neoplasm of prostate: Secondary | ICD-10-CM | POA: Diagnosis not present

## 2016-04-12 DIAGNOSIS — Z131 Encounter for screening for diabetes mellitus: Secondary | ICD-10-CM | POA: Diagnosis not present

## 2016-04-12 DIAGNOSIS — J4520 Mild intermittent asthma, uncomplicated: Secondary | ICD-10-CM | POA: Diagnosis not present

## 2016-04-12 DIAGNOSIS — Z0000 Encounter for general adult medical examination without abnormal findings: Secondary | ICD-10-CM | POA: Diagnosis not present

## 2016-04-13 DIAGNOSIS — K5730 Diverticulosis of large intestine without perforation or abscess without bleeding: Secondary | ICD-10-CM | POA: Diagnosis not present

## 2016-04-13 DIAGNOSIS — Z86010 Personal history of colonic polyps: Secondary | ICD-10-CM | POA: Diagnosis not present

## 2016-06-04 DIAGNOSIS — H5213 Myopia, bilateral: Secondary | ICD-10-CM | POA: Diagnosis not present

## 2016-08-07 DIAGNOSIS — J209 Acute bronchitis, unspecified: Secondary | ICD-10-CM | POA: Diagnosis not present

## 2016-11-26 ENCOUNTER — Emergency Department (HOSPITAL_BASED_OUTPATIENT_CLINIC_OR_DEPARTMENT_OTHER): Payer: PPO

## 2016-11-26 ENCOUNTER — Emergency Department (HOSPITAL_BASED_OUTPATIENT_CLINIC_OR_DEPARTMENT_OTHER)
Admission: EM | Admit: 2016-11-26 | Discharge: 2016-11-26 | Disposition: A | Discharge status: Home / Self Care | Payer: PPO | Attending: Emergency Medicine | Admitting: Emergency Medicine

## 2016-11-26 ENCOUNTER — Encounter (HOSPITAL_BASED_OUTPATIENT_CLINIC_OR_DEPARTMENT_OTHER): Payer: Self-pay | Admitting: *Deleted

## 2016-11-26 DIAGNOSIS — M79661 Pain in right lower leg: Secondary | ICD-10-CM | POA: Diagnosis not present

## 2016-11-26 DIAGNOSIS — Z7982 Long term (current) use of aspirin: Secondary | ICD-10-CM | POA: Diagnosis not present

## 2016-11-26 DIAGNOSIS — Z79899 Other long term (current) drug therapy: Secondary | ICD-10-CM | POA: Diagnosis not present

## 2016-11-26 DIAGNOSIS — Z7901 Long term (current) use of anticoagulants: Secondary | ICD-10-CM | POA: Diagnosis not present

## 2016-11-26 DIAGNOSIS — J45901 Unspecified asthma with (acute) exacerbation: Secondary | ICD-10-CM | POA: Diagnosis not present

## 2016-11-26 DIAGNOSIS — M79604 Pain in right leg: Secondary | ICD-10-CM | POA: Diagnosis present

## 2016-11-26 DIAGNOSIS — I8001 Phlebitis and thrombophlebitis of superficial vessels of right lower extremity: Secondary | ICD-10-CM | POA: Diagnosis not present

## 2016-11-26 MED ORDER — ASPIRIN 81 MG PO CHEW
324.0000 mg | CHEWABLE_TABLET | Freq: Once | ORAL | Status: AC
  Administered 2016-11-26: 324 mg via ORAL
  Filled 2016-11-26: qty 4

## 2016-11-26 NOTE — ED Notes (Addendum)
Patient stated that he had a history of DVT to right leg after his 5th metatarsal repair surgery in 2010.  He was placed on Coumadin for 4 months.  He also stated that he travels a lot but he wears a compression stocking.  This past Wednesday, he was exercising and his wife noticed a big bruised behind his knee but he also claimed that he has cramps to his right leg.

## 2016-11-26 NOTE — ED Provider Notes (Signed)
[facility] Provider Note   CSN: [id number] Arrival date & time: 11/26/16  6629   By signing my name below, I, Soijett Blue, attest that this documentation has been prepared under the direction and in the presence of Lajean Saver, MD. Electronically Signed: [place], ED Scribe. 11/26/16. 9:59 PM.  History   Chief Complaint Chief Complaint  Patient presents with  . Leg Pain    HPI [name] is a 66 y.o. male with a PMHx of right DVT, who presents to the Emergency Department complaining of right leg pain onset 1 week ago. Pt reports associated bruise to right inner thigh. Pt has not tried any medications for the relief of his symptoms. Pt reports that he experienced a sudden onset cramp to his right inner thigh and posterior knee while laying in bed after flying to [place] last week. He notes that he has a hx of DVT to his right leg s/p right foot surgery that was treated with lovenox and coumadin. Pt states that he went to a [facility] and was informed to come to the ED for further treatment. He denies wound, CP, SOB, numbness, tingling, and any other symptoms.     The history is provided by the patient. No language interpreter was used.    Past Medical History:  Diagnosis Date  . Arthritis    oa  . Asthma    occasional allergy related  . DVT of lower limb, acute ([place]) aug 2010   right  . Sarcoidosis    no current pulmonary md, followed by dr Melinda Crutch for    Patient Active Problem List   Diagnosis Date Noted  . Acute medial meniscal tear 06/23/2013  . SARCOIDOSIS (ANY SITE) 05/12/2007  . ASTHMA 04/02/2007  . ALLERGY 04/02/2007    Past Surgical History:  Procedure Laterality Date  . 5th metatarsal repair Right 2010  . KNEE ARTHROSCOPY Right 06/24/2013   Procedure: RIGHT KNEE ARTHROSCOPY WITH DEBRIDEMENT ;  Surgeon: Gearlean Alf, MD;  Location: [facility];  Service: Orthopedics;  Laterality: Right;       Home Medications    Prior to Admission  medications   Medication Sig Start Date End Date Taking? Authorizing Provider  aspirin EC 81 MG tablet Take 81 mg by mouth daily.   Yes [provider]  Multiple Vitamin (MULTIVITAMIN WITH MINERALS) TABS tablet Take 1 tablet by mouth daily.   Yes [provider]  Calcium-Magnesium-Vitamin D (CALCIUM MAGNESIUM PO) Take 1 tablet by mouth daily.    [provider]  Cinnamon 500 MG TABS Take 1 tablet by mouth daily.    [provider]  Coenzyme Q10 (CO Q 10) 100 MG CAPS Take 1 capsule by mouth daily.    [provider]  fexofenadine-pseudoephedrine (ALLEGRA-D) 60-120 MG per tablet Take 1 tablet by mouth daily.     [provider]  glucosamine-chondroitin 500-400 MG tablet Take 1 tablet by mouth daily.    [provider]  HYDROcodone-acetaminophen (NORCO) 5-325 MG per tablet Take 1-2 tablets by mouth every 6 (six) hours as needed for moderate pain. 06/24/13   Gaynelle Arabian, MD  meloxicam (MOBIC) 15 MG tablet Take 15 mg by mouth daily.    [provider]  methocarbamol (ROBAXIN) 500 MG tablet Take 1 tablet (500 mg total) by mouth 4 (four) times daily. As needed for muscle spasm 06/24/13   Aluisio, Pilar Plate, MD  omega-3 acid ethyl esters (LOVAZA) 1 G capsule Take 1 g by  mouth daily.    [provider]  rivaroxaban (XARELTO) 10 MG TABS tablet Take 1 tablet (10 mg total) by mouth daily. 06/25/13   AluisioPilar Plate, MD    Family History No family history on file.  Social History Social History  Substance Use Topics  . Smoking status: Never Smoker  . Smokeless tobacco: Never Used  . Alcohol use Yes     Comment: occasional weekends      Allergies   Patient has no known allergies.   Review of Systems Review of Systems  Constitutional: Negative for fever.  Respiratory: Negative for shortness of breath.   Cardiovascular: Negative for chest pain.  Musculoskeletal: Positive for arthralgias (RLE) and myalgias (RLE).  Skin:  Positive for color change (bruise to right inner thigh). Negative for wound.  Neurological: Negative for numbness.       No tingling  Hematological: Does not bruise/bleed easily.     Physical Exam Updated Vital Signs BP (!) 162/87 (BP Location: Left Arm)   Pulse 89   Temp 98.2 F (36.8 C) (Oral)   Resp 18   SpO2 99%   Physical Exam  Constitutional: He is oriented to person, place, and time. He appears well-developed and well-nourished. No distress.  HENT:  Head: Normocephalic and atraumatic.  Eyes: EOM are normal.  Neck: Neck supple.  Cardiovascular: Normal rate.   Pulmonary/Chest: Effort normal. No respiratory distress.  Abdominal: He exhibits no distension.  Musculoskeletal: Normal range of motion.  Ecchymosis to right medial posterior upper leg just proximal to knee. Mild swelling. Compartments of leg v soft, not tense. Distal pulses palp.   Neurological: He is alert and oriented to person, place, and time.  Skin: Skin is warm and dry. No rash noted.  Psychiatric: He has a normal mood and affect. His behavior is normal.  Nursing note and vitals reviewed.    ED Treatments / Results  DIAGNOSTIC STUDIES: Oxygen Saturation is 99% on RA, nl by my interpretation.    COORDINATION OF CARE: 8:37 PM Discussed treatment plan with pt at bedside and pt agreed to plan.   Labs (all labs ordered are listed, but only abnormal results are displayed) Labs Reviewed - No data to display  EKG  EKG Interpretation None       Radiology US Venous Img Lower Unilateral Right  Result Date: 11/26/2016 CLINICAL DATA:  Right lower extremity cramping and ecchymosis. EXAM: RIGHT LOWER EXTREMITY VENOUS DOPPLER ULTRASOUND TECHNIQUE: Gray-scale sonography with graded compression, as well as color Doppler and duplex ultrasound were performed to evaluate the lower extremity deep venous systems from the level of the common femoral vein and including the common femoral, femoral, profunda femoral,  popliteal and calf veins including the posterior tibial, peroneal and gastrocnemius veins when visible. The superficial great saphenous vein was also interrogated. Spectral Doppler was utilized to evaluate flow at rest and with distal augmentation maneuvers in the common femoral, femoral and popliteal veins. COMPARISON:  None. FINDINGS: Contralateral Common Femoral Vein: Respiratory phasicity is normal and symmetric with the symptomatic side. No evidence of thrombus. Normal compressibility. Common Femoral Vein: No evidence of thrombus. Normal compressibility, respiratory phasicity and response to augmentation. Saphenofemoral Junction: No evidence of thrombus. Normal compressibility and flow on color Doppler imaging. Profunda Femoral Vein: No evidence of thrombus. Normal compressibility and flow on color Doppler imaging. Femoral Vein: No evidence of thrombus. Normal compressibility, respiratory phasicity and response to augmentation. Popliteal Vein: No evidence of thrombus. Normal compressibility, respiratory phasicity and response to augmentation.  Calf Veins: No evidence of thrombus. Normal compressibility and flow on color Doppler imaging. Superficial Great Saphenous Vein: There is occlusive superficial thrombophlebitis of the right greater saphenous vein extending inferiorly from just above the knee into the right calf. Venous Reflux:  None. Other Findings:  None. IMPRESSION: Occlusive superficial thrombophlebitis of the right greater saphenous vein as detailed. No evidence of DVT within the right lower extremity. Electronically Signed   By: Ilona Sorrel M.D.   On: 11/26/2016 21:55    Procedures Procedures (including critical care time)  Medications Ordered in ED Medications - No data to display   Initial Impression / Assessment and Plan / ED Course  I have reviewed the triage vital signs and the nursing notes.  Pertinent labs & imaging results that were available during my care of the patient were  reviewed by me and considered in my medical decision making (see chart for details).  Venous doppler c/w saphenous vein superficial thrombophlebitis, no dvt.    Will place on full strength asa q day. Warm compresses. Rest. Elevation.  Patient has f/u with pcp tomorrow AM - will keep that appt.   Final Clinical Impressions(s) / ED Diagnoses   Final diagnoses:  None    New Prescriptions New Prescriptions   No medications on file   I personally performed the services described in this documentation, which was scribed in my presence. The recorded information has been reviewed and considered. Lajean Saver, MD    Lajean Saver, MD 11/26/16 2219

## 2016-11-26 NOTE — ED Notes (Signed)
Off the floor

## 2016-11-26 NOTE — ED Triage Notes (Signed)
Last week he had a severe cramp behind his right knee after flying to [place]. Tonight he noticed a huge bruise in the same area. Hx of DVT.

## 2016-11-26 NOTE — Discharge Instructions (Signed)
It was our pleasure to provide your ER care today - we hope that you feel better.  Take a full strength, enteric coated aspirin a day.    Warm compresses/heat to sore area.   Elevate leg.   Follow up with your doctor tomorrow as planned - discuss follow up/repeat imaging, and/or referral to vascular/vein speciality follow up then.   Return to ER if worse, severe leg pain or swelling, chest pain, trouble breathing, other concern.

## 2016-11-26 NOTE — ED Notes (Signed)
ED Provider at bedside. 

## 2016-11-27 DIAGNOSIS — I809 Phlebitis and thrombophlebitis of unspecified site: Secondary | ICD-10-CM | POA: Diagnosis not present

## 2016-11-27 DIAGNOSIS — I872 Venous insufficiency (chronic) (peripheral): Secondary | ICD-10-CM | POA: Diagnosis not present

## 2016-11-29 ENCOUNTER — Other Ambulatory Visit: Payer: Self-pay

## 2016-11-29 DIAGNOSIS — I83891 Varicose veins of right lower extremities with other complications: Secondary | ICD-10-CM

## 2016-11-30 ENCOUNTER — Encounter: Payer: Self-pay | Admitting: Vascular Surgery

## 2016-12-07 ENCOUNTER — Encounter: Payer: Self-pay | Admitting: Vascular Surgery

## 2016-12-10 ENCOUNTER — Ambulatory Visit (HOSPITAL_COMMUNITY)
Admission: RE | Admit: 2016-12-10 | Discharge: 2016-12-10 | Disposition: A | Discharge status: Home / Self Care | Payer: PPO | Source: Ambulatory Visit | Attending: Surgery | Admitting: Surgery

## 2016-12-10 ENCOUNTER — Ambulatory Visit (INDEPENDENT_AMBULATORY_CARE_PROVIDER_SITE_OTHER): Payer: PPO | Admitting: Vascular Surgery

## 2016-12-10 ENCOUNTER — Encounter: Payer: Self-pay | Admitting: Vascular Surgery

## 2016-12-10 VITALS — BP 162/86 | HR 89 | Temp 97.3°F | Resp 18 | Ht 76.0 in | Wt 201.0 lb

## 2016-12-10 DIAGNOSIS — I8001 Phlebitis and thrombophlebitis of superficial vessels of right lower extremity: Secondary | ICD-10-CM

## 2016-12-10 DIAGNOSIS — I83891 Varicose veins of right lower extremities with other complications: Secondary | ICD-10-CM | POA: Insufficient documentation

## 2016-12-10 NOTE — Progress Notes (Signed)
Subjective:     Patient ID: [name], male   DOB: [birth year], 66 y.o.   MRN: [id number]  HPI This 66 year old male was referred by Dr. Melinda Crutch for evaluation of recent superficial thrombophlebitis right leg. Patient has a remote history of a DVT in the right leg following foot surgery about 8 years ago. He was treated with Coumadin for about 4 months and current [name] is on one aspirin per day. [place] a [employer]. He recently developed a spontaneous onset of a "bruise" in the posterior thigh and calf area medially and ultrasound was performed in [facility] which revealed superficial thrombophlebitis in the midportion of the great saphenous vein. He was referred for further evaluation. He was taking one aspirin per day when this occurred. He's had no recent injury or periods of inactivity.    rReview of Systems denies chest pain, dyspnea on exertion, PND, orthopnea, hemoptysis, claudication. Patient is quite active with history of sarcoidosis asthma and allergy     Objective:   Physical Exam BP (!) 162/86 (BP Location: Left Arm, Patient Position: Sitting, Cuff Size: Normal)   Pulse 89   Temp (!) 97.3 F (36.3 C)   Resp 18   Ht 6\' 4"  (1.93 m)   Wt 201 lb (91.2 kg)   SpO2 99%   BMI 24.47 kg/m     Gen.-alert and oriented x3 in no apparent distress HEENT normal for age Lungs no rhonchi or wheezing Cardiovascular regular rhythm no murmurs carotid pulses 3+ palpable no bruits audible Abdomen soft nontender no palpable masses Musculoskeletal free of  major deformities Skin clear -no rashes Neurologic normal Lower extremities 3+ femoral and dorsalis pedis pulses palpable bilaterally with very mild edema in the right calf compared to the left with 1 cm larger circumference below the knee No bulging varicosities, spider veins, or hyperpigmentation or ulceration noted.  Today I ordered a venous reflux exam of the right leg which I reviewed and  interpreted. There was no DVT. There was reflux in the deep system from the common femoral to the popliteal level and some reflux in the great saphenous vein the vein was small caliber.  Today performed a bedside sono site ultrasound exam which revealed no evidence of thrombus in the great saphenous vein       Assessment:     #1 remote history of DVT 8 years ago following foot surgery with immobilization #2 recent episode of superficial thrombophlebitis great saphenous vein-now resolved-patient taking aspirin at that time #3 frequent flights to the [employer] for [employer]    Plan:     #1 do not feel patient needs to be on chronic anticoagulation at this time but if he develops another episode of thrombosis either superficial or DP should be started on chronic anticoagulation #2 continue daily aspirin indefinitely #3 elevate foot of bed 2 inches #4 where elastic compression stockings-long leg on flights and continue daily aspirin #5 frequent exercising of legs and ambulation during flight #6 no further suggestion

## 2016-12-11 ENCOUNTER — Other Ambulatory Visit: Payer: Self-pay | Admitting: Dermatology

## 2016-12-11 DIAGNOSIS — D229 Melanocytic nevi, unspecified: Secondary | ICD-10-CM | POA: Diagnosis not present

## 2016-12-11 DIAGNOSIS — L821 Other seborrheic keratosis: Secondary | ICD-10-CM | POA: Diagnosis not present

## 2016-12-11 DIAGNOSIS — C4491 Basal cell carcinoma of skin, unspecified: Secondary | ICD-10-CM

## 2016-12-11 DIAGNOSIS — C44319 Basal cell carcinoma of skin of other parts of face: Secondary | ICD-10-CM | POA: Diagnosis not present

## 2016-12-11 HISTORY — DX: Basal cell carcinoma of skin, unspecified: C44.91

## 2016-12-17 ENCOUNTER — Encounter: Payer: Self-pay | Admitting: Family Medicine

## 2017-02-11 ENCOUNTER — Other Ambulatory Visit: Payer: Self-pay | Admitting: Dermatology

## 2017-02-11 DIAGNOSIS — L988 Other specified disorders of the skin and subcutaneous tissue: Secondary | ICD-10-CM | POA: Diagnosis not present

## 2017-02-11 DIAGNOSIS — C44319 Basal cell carcinoma of skin of other parts of face: Secondary | ICD-10-CM | POA: Diagnosis not present

## 2017-07-09 DIAGNOSIS — Z125 Encounter for screening for malignant neoplasm of prostate: Secondary | ICD-10-CM | POA: Diagnosis not present

## 2017-07-09 DIAGNOSIS — Z131 Encounter for screening for diabetes mellitus: Secondary | ICD-10-CM | POA: Diagnosis not present

## 2017-07-09 DIAGNOSIS — E7800 Pure hypercholesterolemia, unspecified: Secondary | ICD-10-CM | POA: Diagnosis not present

## 2017-07-09 DIAGNOSIS — Z862 Personal history of diseases of the blood and blood-forming organs and certain disorders involving the immune mechanism: Secondary | ICD-10-CM | POA: Diagnosis not present

## 2017-07-09 DIAGNOSIS — R3989 Other symptoms and signs involving the genitourinary system: Secondary | ICD-10-CM | POA: Diagnosis not present

## 2017-07-09 DIAGNOSIS — Z0000 Encounter for general adult medical examination without abnormal findings: Secondary | ICD-10-CM | POA: Diagnosis not present

## 2017-07-30 DIAGNOSIS — H0019 Chalazion unspecified eye, unspecified eyelid: Secondary | ICD-10-CM | POA: Diagnosis not present

## 2017-07-30 DIAGNOSIS — L080 Pyoderma: Secondary | ICD-10-CM | POA: Diagnosis not present

## 2017-08-05 DIAGNOSIS — R972 Elevated prostate specific antigen [PSA]: Secondary | ICD-10-CM | POA: Diagnosis not present

## 2017-10-14 DIAGNOSIS — H2513 Age-related nuclear cataract, bilateral: Secondary | ICD-10-CM | POA: Diagnosis not present

## 2017-11-04 DIAGNOSIS — R972 Elevated prostate specific antigen [PSA]: Secondary | ICD-10-CM | POA: Diagnosis not present

## 2017-12-10 DIAGNOSIS — R972 Elevated prostate specific antigen [PSA]: Secondary | ICD-10-CM | POA: Diagnosis not present

## 2018-01-15 DIAGNOSIS — H9042 Sensorineural hearing loss, unilateral, left ear, with unrestricted hearing on the contralateral side: Secondary | ICD-10-CM | POA: Diagnosis not present

## 2018-01-21 DIAGNOSIS — R972 Elevated prostate specific antigen [PSA]: Secondary | ICD-10-CM | POA: Diagnosis not present

## 2018-01-22 ENCOUNTER — Other Ambulatory Visit: Payer: Self-pay | Admitting: Otolaryngology

## 2018-01-22 DIAGNOSIS — H9042 Sensorineural hearing loss, unilateral, left ear, with unrestricted hearing on the contralateral side: Secondary | ICD-10-CM

## 2018-02-04 ENCOUNTER — Ambulatory Visit
Admission: RE | Admit: 2018-02-04 | Discharge: 2018-02-04 | Disposition: A | Discharge status: Home / Self Care | Payer: PPO | Source: Ambulatory Visit | Attending: Otolaryngology | Admitting: Otolaryngology

## 2018-02-04 DIAGNOSIS — H9042 Sensorineural hearing loss, unilateral, left ear, with unrestricted hearing on the contralateral side: Secondary | ICD-10-CM

## 2018-02-04 MED ORDER — GADOBENATE DIMEGLUMINE 529 MG/ML IV SOLN
18.0000 mL | Freq: Once | INTRAVENOUS | Status: AC | PRN
Start: 2018-02-04 — End: 2018-02-04
  Administered 2018-02-04: 18 mL via INTRAVENOUS

## 2018-02-24 DIAGNOSIS — C61 Malignant neoplasm of prostate: Secondary | ICD-10-CM | POA: Diagnosis not present

## 2018-03-24 ENCOUNTER — Other Ambulatory Visit: Payer: Self-pay | Admitting: Physician Assistant

## 2018-03-24 DIAGNOSIS — R1030 Lower abdominal pain, unspecified: Secondary | ICD-10-CM | POA: Diagnosis not present

## 2018-03-24 DIAGNOSIS — Z86010 Personal history of colonic polyps: Secondary | ICD-10-CM | POA: Diagnosis not present

## 2018-03-24 DIAGNOSIS — R195 Other fecal abnormalities: Secondary | ICD-10-CM

## 2018-03-24 DIAGNOSIS — R1031 Right lower quadrant pain: Secondary | ICD-10-CM | POA: Diagnosis not present

## 2018-03-24 DIAGNOSIS — C61 Malignant neoplasm of prostate: Secondary | ICD-10-CM | POA: Diagnosis not present

## 2018-03-25 DIAGNOSIS — L570 Actinic keratosis: Secondary | ICD-10-CM | POA: Diagnosis not present

## 2018-03-27 ENCOUNTER — Ambulatory Visit
Admission: RE | Admit: 2018-03-27 | Discharge: 2018-03-27 | Disposition: A | Discharge status: Home / Self Care | Payer: PPO | Source: Ambulatory Visit | Attending: Physician Assistant | Admitting: Physician Assistant

## 2018-03-27 DIAGNOSIS — R1030 Lower abdominal pain, unspecified: Secondary | ICD-10-CM

## 2018-03-27 DIAGNOSIS — K5730 Diverticulosis of large intestine without perforation or abscess without bleeding: Secondary | ICD-10-CM | POA: Diagnosis not present

## 2018-03-27 DIAGNOSIS — R195 Other fecal abnormalities: Secondary | ICD-10-CM

## 2018-03-27 MED ORDER — IOPAMIDOL (ISOVUE-300) INJECTION 61%
100.0000 mL | Freq: Once | INTRAVENOUS | Status: AC | PRN
  Administered 2018-03-27: 100 mL via INTRAVENOUS

## 2018-04-21 DIAGNOSIS — C61 Malignant neoplasm of prostate: Secondary | ICD-10-CM | POA: Diagnosis not present

## 2018-06-11 DIAGNOSIS — M79672 Pain in left foot: Secondary | ICD-10-CM | POA: Diagnosis not present

## 2018-06-11 DIAGNOSIS — M79671 Pain in right foot: Secondary | ICD-10-CM | POA: Diagnosis not present

## 2018-06-11 DIAGNOSIS — G5762 Lesion of plantar nerve, left lower limb: Secondary | ICD-10-CM | POA: Diagnosis not present

## 2018-06-11 DIAGNOSIS — M7742 Metatarsalgia, left foot: Secondary | ICD-10-CM | POA: Diagnosis not present

## 2018-06-11 DIAGNOSIS — G5761 Lesion of plantar nerve, right lower limb: Secondary | ICD-10-CM | POA: Diagnosis not present

## 2018-06-11 DIAGNOSIS — M7741 Metatarsalgia, right foot: Secondary | ICD-10-CM | POA: Diagnosis not present

## 2018-06-23 DIAGNOSIS — C61 Malignant neoplasm of prostate: Secondary | ICD-10-CM | POA: Diagnosis not present

## 2018-07-21 DIAGNOSIS — Z1159 Encounter for screening for other viral diseases: Secondary | ICD-10-CM | POA: Diagnosis not present

## 2018-07-21 DIAGNOSIS — C61 Malignant neoplasm of prostate: Secondary | ICD-10-CM | POA: Diagnosis not present

## 2018-07-21 DIAGNOSIS — E7800 Pure hypercholesterolemia, unspecified: Secondary | ICD-10-CM | POA: Diagnosis not present

## 2018-07-21 DIAGNOSIS — Z0000 Encounter for general adult medical examination without abnormal findings: Secondary | ICD-10-CM | POA: Diagnosis not present

## 2018-07-21 DIAGNOSIS — Z131 Encounter for screening for diabetes mellitus: Secondary | ICD-10-CM | POA: Diagnosis not present

## 2018-07-22 DIAGNOSIS — C61 Malignant neoplasm of prostate: Secondary | ICD-10-CM | POA: Diagnosis not present

## 2018-07-25 ENCOUNTER — Other Ambulatory Visit: Payer: Self-pay | Admitting: Urology

## 2018-07-25 DIAGNOSIS — C61 Malignant neoplasm of prostate: Secondary | ICD-10-CM

## 2018-08-07 ENCOUNTER — Other Ambulatory Visit: Payer: Self-pay

## 2018-08-07 ENCOUNTER — Ambulatory Visit
Admission: RE | Admit: 2018-08-07 | Discharge: 2018-08-07 | Disposition: A | Discharge status: Home / Self Care | Payer: PPO | Source: Ambulatory Visit | Attending: Urology | Admitting: Urology

## 2018-08-07 DIAGNOSIS — C61 Malignant neoplasm of prostate: Secondary | ICD-10-CM | POA: Diagnosis not present

## 2018-08-07 MED ORDER — GADOBENATE DIMEGLUMINE 529 MG/ML IV SOLN
20.0000 mL | Freq: Once | INTRAVENOUS | Status: AC | PRN
  Administered 2018-08-07: 20 mL via INTRAVENOUS

## 2018-08-08 ENCOUNTER — Other Ambulatory Visit: Payer: PPO

## 2018-08-11 DIAGNOSIS — M19071 Primary osteoarthritis, right ankle and foot: Secondary | ICD-10-CM | POA: Diagnosis not present

## 2018-08-11 DIAGNOSIS — R2242 Localized swelling, mass and lump, left lower limb: Secondary | ICD-10-CM | POA: Diagnosis not present

## 2018-08-11 DIAGNOSIS — M79671 Pain in right foot: Secondary | ICD-10-CM | POA: Diagnosis not present

## 2018-08-11 DIAGNOSIS — M79672 Pain in left foot: Secondary | ICD-10-CM | POA: Diagnosis not present

## 2018-08-18 DIAGNOSIS — M79672 Pain in left foot: Secondary | ICD-10-CM | POA: Diagnosis not present

## 2018-08-18 DIAGNOSIS — R002 Palpitations: Secondary | ICD-10-CM | POA: Diagnosis not present

## 2018-08-25 DIAGNOSIS — M7672 Peroneal tendinitis, left leg: Secondary | ICD-10-CM | POA: Diagnosis not present

## 2018-09-26 DIAGNOSIS — C61 Malignant neoplasm of prostate: Secondary | ICD-10-CM | POA: Diagnosis not present

## 2018-11-27 DIAGNOSIS — H0102A Squamous blepharitis right eye, upper and lower eyelids: Secondary | ICD-10-CM | POA: Diagnosis not present

## 2018-11-27 DIAGNOSIS — H04123 Dry eye syndrome of bilateral lacrimal glands: Secondary | ICD-10-CM | POA: Diagnosis not present

## 2018-11-27 DIAGNOSIS — H2513 Age-related nuclear cataract, bilateral: Secondary | ICD-10-CM | POA: Diagnosis not present

## 2018-11-27 DIAGNOSIS — H43811 Vitreous degeneration, right eye: Secondary | ICD-10-CM | POA: Diagnosis not present

## 2018-11-27 DIAGNOSIS — H0102B Squamous blepharitis left eye, upper and lower eyelids: Secondary | ICD-10-CM | POA: Diagnosis not present

## 2018-12-11 DIAGNOSIS — H0102B Squamous blepharitis left eye, upper and lower eyelids: Secondary | ICD-10-CM | POA: Diagnosis not present

## 2018-12-11 DIAGNOSIS — H2513 Age-related nuclear cataract, bilateral: Secondary | ICD-10-CM | POA: Diagnosis not present

## 2018-12-11 DIAGNOSIS — H43811 Vitreous degeneration, right eye: Secondary | ICD-10-CM | POA: Diagnosis not present

## 2018-12-11 DIAGNOSIS — H0102A Squamous blepharitis right eye, upper and lower eyelids: Secondary | ICD-10-CM | POA: Diagnosis not present

## 2018-12-11 DIAGNOSIS — H04123 Dry eye syndrome of bilateral lacrimal glands: Secondary | ICD-10-CM | POA: Diagnosis not present

## 2018-12-15 DIAGNOSIS — N400 Enlarged prostate without lower urinary tract symptoms: Secondary | ICD-10-CM | POA: Diagnosis not present

## 2018-12-15 DIAGNOSIS — C61 Malignant neoplasm of prostate: Secondary | ICD-10-CM | POA: Diagnosis not present

## 2019-02-13 DIAGNOSIS — C61 Malignant neoplasm of prostate: Secondary | ICD-10-CM | POA: Diagnosis not present

## 2019-02-26 DIAGNOSIS — M25561 Pain in right knee: Secondary | ICD-10-CM | POA: Diagnosis not present

## 2019-04-16 DIAGNOSIS — M1711 Unilateral primary osteoarthritis, right knee: Secondary | ICD-10-CM | POA: Diagnosis not present

## 2019-04-16 DIAGNOSIS — M25561 Pain in right knee: Secondary | ICD-10-CM | POA: Diagnosis not present

## 2019-04-23 DIAGNOSIS — M25561 Pain in right knee: Secondary | ICD-10-CM | POA: Diagnosis not present

## 2019-04-23 DIAGNOSIS — M1711 Unilateral primary osteoarthritis, right knee: Secondary | ICD-10-CM | POA: Diagnosis not present

## 2019-04-30 DIAGNOSIS — M25561 Pain in right knee: Secondary | ICD-10-CM | POA: Diagnosis not present

## 2019-04-30 DIAGNOSIS — M1711 Unilateral primary osteoarthritis, right knee: Secondary | ICD-10-CM | POA: Diagnosis not present

## 2019-07-14 ENCOUNTER — Other Ambulatory Visit: Payer: Self-pay | Admitting: Dermatology

## 2019-07-14 DIAGNOSIS — C4492 Squamous cell carcinoma of skin, unspecified: Secondary | ICD-10-CM

## 2019-07-14 DIAGNOSIS — D229 Melanocytic nevi, unspecified: Secondary | ICD-10-CM | POA: Diagnosis not present

## 2019-07-14 DIAGNOSIS — D0471 Carcinoma in situ of skin of right lower limb, including hip: Secondary | ICD-10-CM | POA: Diagnosis not present

## 2019-07-14 HISTORY — DX: Squamous cell carcinoma of skin, unspecified: C44.92

## 2019-07-30 DIAGNOSIS — Z131 Encounter for screening for diabetes mellitus: Secondary | ICD-10-CM | POA: Diagnosis not present

## 2019-07-30 DIAGNOSIS — Z862 Personal history of diseases of the blood and blood-forming organs and certain disorders involving the immune mechanism: Secondary | ICD-10-CM | POA: Diagnosis not present

## 2019-07-30 DIAGNOSIS — Z0000 Encounter for general adult medical examination without abnormal findings: Secondary | ICD-10-CM | POA: Diagnosis not present

## 2019-07-30 DIAGNOSIS — E7800 Pure hypercholesterolemia, unspecified: Secondary | ICD-10-CM | POA: Diagnosis not present

## 2019-07-30 DIAGNOSIS — J4520 Mild intermittent asthma, uncomplicated: Secondary | ICD-10-CM | POA: Diagnosis not present

## 2019-08-26 DIAGNOSIS — C61 Malignant neoplasm of prostate: Secondary | ICD-10-CM | POA: Diagnosis not present

## 2019-08-27 ENCOUNTER — Encounter: Payer: Self-pay | Admitting: Dermatology

## 2019-08-27 ENCOUNTER — Ambulatory Visit (INDEPENDENT_AMBULATORY_CARE_PROVIDER_SITE_OTHER): Payer: PPO | Admitting: Dermatology

## 2019-08-27 ENCOUNTER — Other Ambulatory Visit: Payer: Self-pay

## 2019-08-27 DIAGNOSIS — D0471 Carcinoma in situ of skin of right lower limb, including hip: Secondary | ICD-10-CM | POA: Diagnosis not present

## 2019-08-27 DIAGNOSIS — D099 Carcinoma in situ, unspecified: Secondary | ICD-10-CM

## 2019-08-27 NOTE — Patient Instructions (Signed)

## 2019-08-31 ENCOUNTER — Encounter: Payer: Self-pay | Admitting: Dermatology

## 2019-08-31 ENCOUNTER — Telehealth: Payer: Self-pay

## 2019-08-31 NOTE — Telephone Encounter (Signed)
-----   Message from Lavonna Monarch, MD sent at 08/28/2019  4:57 PM EDT ----- Schedule surgery with Dr. Darene Lamer

## 2019-08-31 NOTE — Progress Notes (Addendum)
   Follow-Up Visit   Subjective  [name] is a 69 y.o. male who presents for the following: Procedure (Here for treatment of SCC in situ on right shin.).  CIS Location: Right shin Duration: Months Quality: Second nearby similar lesion Associated Signs/Symptoms: Modifying Factors:  Severity:  Timing: .Context: for treatment  The following portions of the chart were reviewed this encounter and updated as appropriate: Tobacco  Allergies  Meds  Problems  Med Hx  Surg Hx  Fam Hx      Objective  Well appearing patient in no apparent distress; mood and affect are within normal limits.  Face, arms, legs examined.   Assessment & Plan  Carcinoma in situ of skin of right leg (2) Right Lower Leg - Anterior (2)  Skin / nail biopsy Type of biopsy: tangential   Informed consent: discussed and consent obtained   Timeout: patient name, date of birth, surgical site, and procedure verified   Procedure prep:  Patient was prepped and draped in usual sterile fashion Prep type:  Chlorhexidine Anesthesia: the lesion was anesthetized in a standard fashion   Anesthetic:  1% lidocaine w/ epinephrine 1-100,000 local infiltration Instrument used: flexible razor blade   Hemostasis achieved with: ferric subsulfate   Outcome: patient tolerated procedure well   Post-procedure details: sterile dressing applied and wound care instructions given   Dressing type: bandage and petrolatum   Additional details:  After biposy CX3+5FU (1.9 cm)  Destruction of lesion Complexity: simple   Destruction method: electrodesiccation and curettage   Informed consent: discussed and consent obtained   Timeout:  patient name, date of birth, surgical site, and procedure verified Anesthesia: the lesion was anesthetized in a standard fashion   Anesthetic:  1% lidocaine w/ epinephrine 1-100,000 local infiltration Curettage performed in three different directions: Yes   Curettage cycles:  3 Lesion length (cm):   1.7 Lesion width (cm):  1 Margin per side (cm):  0 Final wound size (cm):  1.7 Hemostasis achieved with:  ferric subsulfate Outcome: patient tolerated procedure well with no complications   Post-procedure details: wound care instructions given   Additional details:  Inoculated with parenteral 5% fluorouracil  Specimen 1 - Surgical pathology Differential Diagnosis: SCC vs ISK Check Margins: No  "New" lesion first tangentially biopsied then both lesions curetted x3 showing width>depth, bases inoculated with parenter 5FU. 1.7 and 1.9cm.  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  Destruction of lesion Complexity: simple   Destruction method: electrodesiccation and curettage   Informed consent: discussed and consent obtained   Timeout:  patient name, date of birth, surgical site, and procedure verified Anesthesia: the lesion was anesthetized in a standard fashion   Anesthetic:  1% lidocaine w/ epinephrine 1-100,000 local infiltration Curettage performed in three different directions: Yes   Curettage cycles:  3 Lesion length (cm):  1.9 Lesion width (cm):  1 Margin per side (cm):  0 Final wound size (cm):  1.9 Hemostasis achieved with:  ferric subsulfate Outcome: patient tolerated procedure well with no complications   Post-procedure details: wound care instructions given   Additional details:  Inoculated with parenteral 5% fluorouracil

## 2019-08-31 NOTE — Telephone Encounter (Signed)
Phone call to patient with his Pathology results.  Pathology results given to patient.

## 2019-09-02 DIAGNOSIS — C61 Malignant neoplasm of prostate: Secondary | ICD-10-CM | POA: Diagnosis not present

## 2019-12-28 DIAGNOSIS — M79672 Pain in left foot: Secondary | ICD-10-CM | POA: Diagnosis not present

## 2020-01-08 DIAGNOSIS — H1045 Other chronic allergic conjunctivitis: Secondary | ICD-10-CM | POA: Diagnosis not present

## 2020-01-08 DIAGNOSIS — H2513 Age-related nuclear cataract, bilateral: Secondary | ICD-10-CM | POA: Diagnosis not present

## 2020-01-08 DIAGNOSIS — H0102B Squamous blepharitis left eye, upper and lower eyelids: Secondary | ICD-10-CM | POA: Diagnosis not present

## 2020-01-08 DIAGNOSIS — H04123 Dry eye syndrome of bilateral lacrimal glands: Secondary | ICD-10-CM | POA: Diagnosis not present

## 2020-01-08 DIAGNOSIS — H52223 Regular astigmatism, bilateral: Secondary | ICD-10-CM | POA: Diagnosis not present

## 2020-01-08 DIAGNOSIS — H5213 Myopia, bilateral: Secondary | ICD-10-CM | POA: Diagnosis not present

## 2020-01-08 DIAGNOSIS — H0102A Squamous blepharitis right eye, upper and lower eyelids: Secondary | ICD-10-CM | POA: Diagnosis not present

## 2020-01-08 DIAGNOSIS — H524 Presbyopia: Secondary | ICD-10-CM | POA: Diagnosis not present

## 2020-01-08 DIAGNOSIS — H43813 Vitreous degeneration, bilateral: Secondary | ICD-10-CM | POA: Diagnosis not present

## 2020-01-11 DIAGNOSIS — M79672 Pain in left foot: Secondary | ICD-10-CM | POA: Diagnosis not present

## 2020-01-25 DIAGNOSIS — M79672 Pain in left foot: Secondary | ICD-10-CM | POA: Diagnosis not present

## 2020-01-26 DIAGNOSIS — Z23 Encounter for immunization: Secondary | ICD-10-CM | POA: Diagnosis not present

## 2020-02-29 ENCOUNTER — Encounter: Payer: Self-pay | Admitting: Dermatology

## 2020-02-29 ENCOUNTER — Ambulatory Visit: Payer: PPO | Admitting: Dermatology

## 2020-02-29 ENCOUNTER — Other Ambulatory Visit: Payer: Self-pay

## 2020-02-29 DIAGNOSIS — Z1283 Encounter for screening for malignant neoplasm of skin: Secondary | ICD-10-CM | POA: Diagnosis not present

## 2020-02-29 DIAGNOSIS — Z86018 Personal history of other benign neoplasm: Secondary | ICD-10-CM | POA: Diagnosis not present

## 2020-02-29 DIAGNOSIS — L821 Other seborrheic keratosis: Secondary | ICD-10-CM | POA: Diagnosis not present

## 2020-02-29 DIAGNOSIS — Z85828 Personal history of other malignant neoplasm of skin: Secondary | ICD-10-CM

## 2020-02-29 DIAGNOSIS — Z87898 Personal history of other specified conditions: Secondary | ICD-10-CM

## 2020-02-29 NOTE — Patient Instructions (Addendum)
Routine follow-up for [name] who had spots removed from his right shin which are perfectly clear with minimal scar just some hyperpigmentation.  On the left shin are 2 keratoses one on the left outer shin and one just above above the left outer knee which do not currently require watching or removal.  The head and neck examination is perfectly clear.  Routine general skin check annually.  [name] knows that if he sees something before then I would be delighted to see him.

## 2020-03-02 DIAGNOSIS — C61 Malignant neoplasm of prostate: Secondary | ICD-10-CM | POA: Diagnosis not present

## 2020-03-03 NOTE — Progress Notes (Signed)
   Follow-Up Visit   Subjective  [name] is a 69 y.o. male who presents for the following: Follow-up (52mo check no new concerns).  Follow up Location:  Duration:  Quality:  Associated Signs/Symptoms: Modifying Factors:  Severity:  Timing: Context:   Objective  Well appearing patient in no apparent distress; mood and affect are within normal limits.  All skin waist up examined.   Assessment & Plan    Routine follow-up for [name] who had spots removed from his right shin which are perfectly clear with minimal scar just some hyperpigmentation.  On the left shin are 2 keratoses one on the left outer shin and one just above above the left outer knee which do not currently require watching or removal.  The head and neck examination is perfectly clear.  Routine general skin check annually.  [name] knows that if he sees something before then I would be delighted to see him.    History of squamous cell carcinoma of skin Right Lower Leg - Anterior  Patient can self examined twice annually.  History of basal cell cancer Right Forehead  Self examine twice annually  History of atypical nevus Right Upper Back  Annual dermatologist examination  Seborrheic keratosis Left Lower Leg - Anterior  Patient has 2 keratosis that need no treatment at this time.  Skin cancer screening performed today.    I, Lavonna Monarch, MD, have reviewed all documentation for this visit.  The documentation on 03/07/20 for the exam, diagnosis, procedures, and orders are all accurate and complete.

## 2020-03-07 ENCOUNTER — Encounter: Payer: Self-pay | Admitting: Dermatology

## 2020-03-09 DIAGNOSIS — C61 Malignant neoplasm of prostate: Secondary | ICD-10-CM | POA: Diagnosis not present

## 2020-03-09 DIAGNOSIS — N400 Enlarged prostate without lower urinary tract symptoms: Secondary | ICD-10-CM | POA: Diagnosis not present

## 2020-03-10 ENCOUNTER — Other Ambulatory Visit: Payer: Self-pay | Admitting: Urology

## 2020-03-10 DIAGNOSIS — C61 Malignant neoplasm of prostate: Secondary | ICD-10-CM

## 2020-04-18 ENCOUNTER — Ambulatory Visit
Admission: RE | Admit: 2020-04-18 | Discharge: 2020-04-18 | Disposition: A | Discharge status: Home / Self Care | Payer: PPO | Source: Ambulatory Visit | Attending: Urology | Admitting: Urology

## 2020-04-18 ENCOUNTER — Other Ambulatory Visit: Payer: Self-pay

## 2020-04-18 DIAGNOSIS — C61 Malignant neoplasm of prostate: Secondary | ICD-10-CM

## 2020-04-18 DIAGNOSIS — R972 Elevated prostate specific antigen [PSA]: Secondary | ICD-10-CM | POA: Diagnosis not present

## 2020-04-18 MED ORDER — GADOBENATE DIMEGLUMINE 529 MG/ML IV SOLN
19.0000 mL | Freq: Once | INTRAVENOUS | Status: AC | PRN
  Administered 2020-04-18: 19 mL via INTRAVENOUS

## 2020-04-22 DIAGNOSIS — S92355G Nondisplaced fracture of fifth metatarsal bone, left foot, subsequent encounter for fracture with delayed healing: Secondary | ICD-10-CM | POA: Diagnosis not present

## 2020-04-22 DIAGNOSIS — M79672 Pain in left foot: Secondary | ICD-10-CM | POA: Diagnosis not present

## 2020-04-22 DIAGNOSIS — M216X2 Other acquired deformities of left foot: Secondary | ICD-10-CM | POA: Diagnosis not present

## 2020-04-22 DIAGNOSIS — M25572 Pain in left ankle and joints of left foot: Secondary | ICD-10-CM | POA: Diagnosis not present

## 2020-05-23 DIAGNOSIS — M25572 Pain in left ankle and joints of left foot: Secondary | ICD-10-CM | POA: Diagnosis not present

## 2020-06-01 DIAGNOSIS — S92352K Displaced fracture of fifth metatarsal bone, left foot, subsequent encounter for fracture with nonunion: Secondary | ICD-10-CM | POA: Diagnosis not present

## 2020-06-09 DIAGNOSIS — C61 Malignant neoplasm of prostate: Secondary | ICD-10-CM | POA: Diagnosis not present

## 2020-06-14 HISTORY — PX: OTHER SURGICAL HISTORY: SHX169

## 2020-06-28 DIAGNOSIS — S92352K Displaced fracture of fifth metatarsal bone, left foot, subsequent encounter for fracture with nonunion: Secondary | ICD-10-CM | POA: Diagnosis not present

## 2020-06-28 DIAGNOSIS — G8918 Other acute postprocedural pain: Secondary | ICD-10-CM | POA: Diagnosis not present

## 2020-08-08 DIAGNOSIS — Z131 Encounter for screening for diabetes mellitus: Secondary | ICD-10-CM | POA: Diagnosis not present

## 2020-08-08 DIAGNOSIS — Z4789 Encounter for other orthopedic aftercare: Secondary | ICD-10-CM | POA: Diagnosis not present

## 2020-08-08 DIAGNOSIS — S92352K Displaced fracture of fifth metatarsal bone, left foot, subsequent encounter for fracture with nonunion: Secondary | ICD-10-CM | POA: Diagnosis not present

## 2020-08-08 DIAGNOSIS — E7800 Pure hypercholesterolemia, unspecified: Secondary | ICD-10-CM | POA: Diagnosis not present

## 2020-08-18 DIAGNOSIS — R7309 Other abnormal glucose: Secondary | ICD-10-CM | POA: Diagnosis not present

## 2020-08-18 DIAGNOSIS — E7800 Pure hypercholesterolemia, unspecified: Secondary | ICD-10-CM | POA: Diagnosis not present

## 2020-08-18 DIAGNOSIS — Z0000 Encounter for general adult medical examination without abnormal findings: Secondary | ICD-10-CM | POA: Diagnosis not present

## 2020-08-18 DIAGNOSIS — Z79899 Other long term (current) drug therapy: Secondary | ICD-10-CM | POA: Diagnosis not present

## 2020-08-24 DIAGNOSIS — M1711 Unilateral primary osteoarthritis, right knee: Secondary | ICD-10-CM | POA: Diagnosis not present

## 2020-08-24 DIAGNOSIS — M1712 Unilateral primary osteoarthritis, left knee: Secondary | ICD-10-CM | POA: Diagnosis not present

## 2020-08-24 DIAGNOSIS — M170 Bilateral primary osteoarthritis of knee: Secondary | ICD-10-CM | POA: Diagnosis not present

## 2020-08-29 DIAGNOSIS — C61 Malignant neoplasm of prostate: Secondary | ICD-10-CM | POA: Diagnosis not present

## 2020-08-30 DIAGNOSIS — M1711 Unilateral primary osteoarthritis, right knee: Secondary | ICD-10-CM | POA: Diagnosis not present

## 2020-08-30 DIAGNOSIS — M1712 Unilateral primary osteoarthritis, left knee: Secondary | ICD-10-CM | POA: Diagnosis not present

## 2020-08-30 DIAGNOSIS — M170 Bilateral primary osteoarthritis of knee: Secondary | ICD-10-CM | POA: Diagnosis not present

## 2020-09-06 DIAGNOSIS — M170 Bilateral primary osteoarthritis of knee: Secondary | ICD-10-CM | POA: Diagnosis not present

## 2020-09-06 DIAGNOSIS — M1711 Unilateral primary osteoarthritis, right knee: Secondary | ICD-10-CM | POA: Diagnosis not present

## 2020-09-06 DIAGNOSIS — M1712 Unilateral primary osteoarthritis, left knee: Secondary | ICD-10-CM | POA: Diagnosis not present

## 2020-09-08 DIAGNOSIS — C61 Malignant neoplasm of prostate: Secondary | ICD-10-CM | POA: Diagnosis not present

## 2020-09-09 DIAGNOSIS — S92352K Displaced fracture of fifth metatarsal bone, left foot, subsequent encounter for fracture with nonunion: Secondary | ICD-10-CM | POA: Diagnosis not present

## 2020-09-09 DIAGNOSIS — Z4789 Encounter for other orthopedic aftercare: Secondary | ICD-10-CM | POA: Diagnosis not present

## 2020-09-21 DIAGNOSIS — C61 Malignant neoplasm of prostate: Secondary | ICD-10-CM | POA: Diagnosis not present

## 2020-10-11 ENCOUNTER — Ambulatory Visit
Admission: RE | Admit: 2020-10-11 | Discharge: 2020-10-11 | Disposition: A | Discharge status: Home / Self Care | Payer: PPO | Source: Ambulatory Visit | Attending: Radiation Oncology | Admitting: Radiation Oncology

## 2020-10-11 ENCOUNTER — Other Ambulatory Visit: Payer: Self-pay

## 2020-10-11 ENCOUNTER — Encounter: Payer: Self-pay | Admitting: Radiation Oncology

## 2020-10-11 VITALS — Ht 75.0 in | Wt 201.0 lb

## 2020-10-11 DIAGNOSIS — C61 Malignant neoplasm of prostate: Secondary | ICD-10-CM | POA: Insufficient documentation

## 2020-10-11 HISTORY — DX: Malignant neoplasm of prostate: C61

## 2020-10-11 NOTE — Progress Notes (Signed)
Radiation Oncology         [phone] ________________________________  Initial Outpatient Consultation - Conducted via Telephone due to current COVID-19 concerns for limiting patient exposure  Name: [name] MRN: [id number]  Date: 10/11/2020  DOB: 1951-02-27  EU:MPNT, Dwyane Luo, MD  Festus Aloe, MD   REFERRING PHYSICIAN: Festus Aloe, MD  DIAGNOSIS: 70 y.o. gentleman with Stage T2a adenocarcinoma of the prostate with Gleason score of 3+4, and PSA of 2.35.    ICD-10-CM   1. Malignant neoplasm of prostate ([place])  C61     HISTORY OF PRESENT ILLNESS: [name] is a 70 y.o. male with a diagnosis of prostate cancer. He was initially referred to Dr. Junious Silk in 2019 for a rising PSA and a right mid gland prostate nodule. He underwent prostate biopsy on 01/21/18 showing Gleason 3+3 in 3 out of 12 cores with a PSA at 3.55 at that time. He opted for active surveillance. His PSA increased to 5 on 07/22/2018 which prompted a prostate MRI on 08/07/18. This showed a PI-RADS 3 lesion in the right posterio-lateral apex. His PSA subsequently decreased to the 2 range, where it has remained.  A surveillance fusion prostate biopsy on 02/13/19 showed 6/15 cores with Gleason 3+3, including the same 3 positive cores, as well as all 3 ROI MRI lesion samples.   His PSA remained stable at 2.41 in 02/2020 and he underwent a repeat surveillance prostate MRI on 04/18/20 showing two small PI-RADS 4 lesions-- including a 1.2 cm lesion in the right mid peripheral zone, roughly stable in size, and a 1 cm lesion in the right anterior transition zone. There was no evidence of extracapsular extension, seminal vesicle invasion, or metastatic disease. The prostate volume was estimated to be 43.5 cc. His PSA remained stable at 2.35 in 08/2020. The patient proceeded to repeat MRI fusion biopsy on 09/21/2020.  The prostate volume measured 37.96 cc by ultrasound.  Out of 18 core biopsies, 7 were positive.  The maximum Gleason  score was 3+4, and this was seen in one sample from ROI MRI lesion #1, as well as the right base (small focus), right base lateral, and right mid lateral. Additionally, Gleason 3+3 was seen in the other two samples from ROI MRI lesion #1 and in the right mid.  The patient reviewed the biopsy results with his urologist and he has kindly been referred today for discussion of potential radiation treatment options.   PREVIOUS RADIATION THERAPY: No  PAST MEDICAL HISTORY:  Past Medical History:  Diagnosis Date  . Arthritis    oa  . Asthma    occasional allergy related  . Atypical nevus 05/03/2005   Left Low Back - Moderate and Right Mid Back - Slight  . Atypical nevus 06/04/2007   Back Right Thigh Inf - Slight to Moderate and Back Right Thigh Sup - Slight  . Atypical nevus 06/12/2010   Right Lower Abdomen - Mild  . BCC (basal cell carcinoma of skin) 05/03/2005   Right Forehead ([place])  . DVT of lower limb, acute ([place]) aug 2010   right  . Nodulo-ulcerative basal cell carcinoma (BCC) 12/11/2016   Behind Left Jawline (CX3+5FU)  . Prostate cancer ([place])   . Sarcoidosis    no current pulmonary md, followed by dr Melinda Crutch for  . SCC (squamous cell carcinoma) 12/15/2015   Right Outer Shin  . Squamous cell carcinoma of skin 07/14/2019   right shin - CX3+5FU      PAST SURGICAL HISTORY: Past  Surgical History:  Procedure Laterality Date  . 5th metatarsal repair Right 2010  . KNEE ARTHROSCOPY Right 06/24/2013   Procedure: RIGHT KNEE ARTHROSCOPY WITH DEBRIDEMENT ;  Surgeon: Gearlean Alf, MD;  Location: [facility];  Service: Orthopedics;  Laterality: Right;  . PROSTATE BIOPSY      FAMILY HISTORY:  Family History  Problem Relation Age of Onset  . Breast cancer Neg Hx   . Colon cancer Neg Hx   . Prostate cancer Neg Hx   . Pancreatic cancer Neg Hx     SOCIAL HISTORY:  Social History   Socioeconomic History  . Marital status: Married    Spouse name: Not on file  . Number of  children: 2  . Years of education: Not on file  . Highest education level: Not on file  Occupational History  . Not on file  Tobacco Use  . Smoking status: Never Smoker  . Smokeless tobacco: Never Used  Vaping Use  . Vaping Use: Never used  Substance and Sexual Activity  . Alcohol use: Yes    Comment: occasional weekends   . Drug use: No  . Sexual activity: Yes  Other Topics Concern  . Not on file  Social History Narrative   One son. One daughter.   Social Determinants of Health   Financial Resource Strain: Not on file  Food Insecurity: Not on file  Transportation Needs: Not on file  Physical Activity: Not on file  Stress: Not on file  Social Connections: Not on file  Intimate Partner Violence: Not on file    ALLERGIES: Patient has no known allergies.  MEDICATIONS:  Current Outpatient Medications  Medication Sig Dispense Refill  . Alpha-Lipoic Acid 100 MG CAPS Take by mouth.    . Ascorbic Acid (VITAMIN C) 1000 MG tablet Take 1,000 mg by mouth daily.    . calcium-vitamin D (OSCAL WITH D) 500-200 MG-UNIT tablet Take 1 tablet by mouth.    . fexofenadine-pseudoephedrine (ALLEGRA-D) 60-120 MG per tablet Take 1 tablet by mouth daily.     Marland Kitchen glucosamine-chondroitin 500-400 MG tablet Take 1 tablet by mouth daily.    . Magnesium 500 MG CAPS Take by mouth.    . Misc Natural Products (PROSTATE HEALTH PO) Take by mouth.    . omega-3 acid ethyl esters (LOVAZA) 1 G capsule Take 1 g by mouth daily.    . Probiotic Product (PROBIOTIC ADVANCED PO) Take by mouth. ALIGN     No current facility-administered medications for this encounter.    REVIEW OF SYSTEMS:  On review of systems, the patient reports that he is doing well overall. He denies any chest pain, shortness of breath, cough, fevers, chills, night sweats, unintended weight changes. He denies any bowel disturbances, and denies abdominal pain, nausea or vomiting. He denies any new musculoskeletal or joint aches or pains. His IPSS  was 2, indicating mild urinary symptoms. He reports some residual hematospermia. His SHIM was 25, indicating he does not have erectile dysfunction. A complete review of systems is obtained and is otherwise negative.  PHYSICAL EXAM:  Wt Readings from Last 3 Encounters:  10/11/20 201 lb (91.2 kg)  12/10/16 201 lb (91.2 kg)  06/04/13 210 lb (95.3 kg)   Temp Readings from Last 3 Encounters:  12/10/16 (!) 97.3 F (36.3 C)  11/26/16 98.2 F (36.8 C) (Oral)  06/24/13 97.9 F (36.6 C) (Oral)   BP Readings from Last 3 Encounters:  12/10/16 (!) 162/86  11/26/16 132/82  06/24/13 (!) 149/80  Pulse Readings from Last 3 Encounters:  12/10/16 89  11/26/16 83  06/24/13 98   Pain Assessment Pain Score: 0-No pain/10  Physical exam not performed in light of telephone encounter.   KPS = 100  100 - Normal; no complaints; no evidence of disease. 90   - Able to carry on normal activity; minor signs or symptoms of disease. 80   - Normal activity with effort; some signs or symptoms of disease. 56   - Cares for self; unable to carry on normal activity or to do active work. 60   - Requires occasional assistance, but is able to care for most of his personal needs. 50   - Requires considerable assistance and frequent medical care. 52   - Disabled; requires special care and assistance. 39   - Severely disabled; hospital admission is indicated although death not imminent. 49   - Very sick; hospital admission necessary; active supportive treatment necessary. 10   - Moribund; fatal processes progressing rapidly. 0     - Dead  Karnofsky DA, Abelmann [place], Craver LS and Burchenal [facility] [phone]) The use of the nitrogen mustards in the palliative treatment of carcinoma: with particular reference to bronchogenic carcinoma Cancer 1 634-56  LABORATORY DATA:  Lab Results  Component Value Date   WBC 7.0 06/04/2013   HGB 14.2 06/04/2013   HCT 40.6 06/04/2013   MCV 91.4 06/04/2013   PLT 280 06/04/2013   Lab  Results  Component Value Date   NA 139 01/03/2009   K 4.5 01/03/2009   CL 106 01/03/2009   CO2 26 01/03/2009   Lab Results  Component Value Date   ALT 19 12/31/2008   AST 22 12/31/2008   ALKPHOS 49 12/31/2008   BILITOT 0.9 12/31/2008     RADIOGRAPHY: No results found.    IMPRESSION/PLAN: This visit was conducted via Telephone to spare the patient unnecessary potential exposure in the healthcare setting during the current COVID-19 pandemic. 1. 70 y.o. gentleman with Stage T2a adenocarcinoma of the prostate with Gleason Score of 3+4, and PSA of 2.35. We discussed the patient's workup and outlined the nature of prostate cancer in this setting. The patient's T stage, Gleason's score, and PSA put him into the favorable intermediate risk group. Accordingly, he is eligible for a variety of potential treatment options including brachytherapy, 5.5 weeks of external radiation, or prostatectomy. We discussed the available radiation techniques, and focused on the details and logistics of delivery. We discussed and outlined the risks, benefits, short and long-term effects associated with radiotherapy and compared and contrasted these with prostatectomy. We discussed the role of SpaceOAR in reducing the rectal toxicity associated with radiotherapy. He appears to have a good understanding of his disease and our treatment recommendations which are of curative intent.  He was encouraged to ask questions that were answered to his stated satisfaction.  At the end of the conversation the patient is interested in moving forward with brachytherapy and use of SpaceOAR gel to reduce rectal toxicity from radiotherapy.  We will share our discussion with Dr. Junious Silk and move forward with scheduling his CT [facility] planning appointment in the near future.  We told the patient that he will receive a call from Romie Jumper in our office who will be working closely with him to coordinate OR scheduling and pre and post procedure  appointments.  We will contact the pharmaceutical rep to ensure that [place] is available at the time of procedure.  We enjoyed meeting him today and  look forward to continuing to participate in his care.  Given current concerns for patient exposure during the COVID-19 pandemic, this encounter was conducted via telephone. The patient was notified in advance and was offered a MyChart meeting to allow for face to face communication but unfortunately reported that he did not have the appropriate resources/technology to support such a visit and instead preferred to proceed with telephone consult. The patient has given verbal consent for this type of encounter. The attendants for this meeting include Tyler Pita MD, Ashlyn Bruning PA-C, [place], and patient [employer]. During the encounter, Tyler Pita MD, Ashlyn Bruning PA-C, and scribe, Wilburn Mylar were located at [facility].  Patient Shaughn Thomley was located at home.  We personally spent 75 minutes in this encounter including chart review, reviewing radiological studies, meeting face-to-face with the patient, entering orders and completing documentation.     Nicholos Johns, PA-C    Tyler Pita, MD  [facility] Direct Dial: [phone]  Fax: [phone] [email or web address]  Skype  [email or web address]   This document serves as a record of services personally performed by Tyler Pita, MD and Freeman Caldron, PA-C. It was created on their behalf by Wilburn Mylar, a trained medical scribe. The creation of this record is based on the scribe's personal observations and the provider's statements to them. This document has been checked and approved by the attending provider.

## 2020-10-11 NOTE — Progress Notes (Signed)
Weight and vitals stable. Reports arthritic joint pain. Denies new pain. Denies dysuria, hematuria, urinary leakage or incontinence. Reports hematospermia. Denies in bowel complaints. Reports he has had a total of 3 prostate biopsy. Patient understands with the last biopsy his gleason score rose from 3+3 to 3+4 thus he was referred to Dr. Tammi Klippel. Patient denies a family hx of cancer. Patient reports all relative have lived into their late 13s. Patient denies having a pacemaker or hx of radiation therapy.   Ht 6\' 3"  (1.905 m)   Wt 201 lb (91.2 kg)   BMI 25.12 kg/m

## 2020-10-13 ENCOUNTER — Telehealth: Payer: Self-pay | Admitting: *Deleted

## 2020-10-13 NOTE — Telephone Encounter (Signed)
CALLED PATIENT TO ASK QUESTIONS, SPOKE WITH PATIENT 

## 2020-10-18 ENCOUNTER — Telehealth: Payer: Self-pay | Admitting: *Deleted

## 2020-10-18 NOTE — Telephone Encounter (Signed)
RETURNED PATIENT'S PHONE CALL, SPOKE WITH PATIENT. ?

## 2020-10-19 ENCOUNTER — Telehealth: Payer: Self-pay | Admitting: *Deleted

## 2020-10-19 NOTE — Telephone Encounter (Signed)
RETURNED PATIENT'S PHONE CALL, SPOKE WITH PATIENT. ?

## 2020-11-01 ENCOUNTER — Other Ambulatory Visit: Payer: Self-pay | Admitting: Urology

## 2020-11-01 DIAGNOSIS — C61 Malignant neoplasm of prostate: Secondary | ICD-10-CM

## 2020-11-02 ENCOUNTER — Telehealth: Payer: Self-pay | Admitting: *Deleted

## 2020-11-02 NOTE — Telephone Encounter (Signed)
CALLED PATIENT TO INFORM OF IMPLANT DATE, LVM FOR A RETURN CALL 

## 2020-11-30 ENCOUNTER — Telehealth: Payer: Self-pay | Admitting: *Deleted

## 2020-11-30 NOTE — Progress Notes (Signed)
  Radiation Oncology         [phone] ________________________________  Name: [name] MRN: [id number]  Date: 12/01/2020  DOB: 1950-07-16  SIMULATION AND TREATMENT PLANNING NOTE PUBIC ARCH STUDY  LT:JQZE, Dwyane Luo, MD  Festus Aloe, MD  DIAGNOSIS: 70 y.o. gentleman with Stage T2a adenocarcinoma of the prostate with Gleason score of 3+4, and PSA of 2.35.  Oncology History  Malignant neoplasm of prostate ([place])  09/21/2020 Cancer Staging   Staging form: Prostate, AJCC 8th Edition - Clinical stage from 09/21/2020: Stage IIB (cT2a, cN0, cM0, PSA: 2.4, Grade Group: 2) - Signed by Freeman Caldron, PA-C on 10/11/2020  Histopathologic type: Adenocarcinoma, NOS  Prostate specific antigen (PSA) range: Less than 10  Gleason primary pattern: 3  Gleason secondary pattern: 4  Gleason score: 7  Histologic grading system: 5 grade system  Number of biopsy cores examined: 18  Number of biopsy cores positive: 7  Location of positive needle core biopsies: One side    10/11/2020 Initial Diagnosis   Malignant neoplasm of prostate ([place])        ICD-10-CM   1. Malignant neoplasm of prostate ([place])  C61       COMPLEX SIMULATION:  The patient presented today for evaluation for possible prostate seed implant. He was brought to the radiation planning suite and placed supine on the CT couch. A 3-dimensional image study set was obtained in upload to the planning computer. There, on each axial slice, I contoured the prostate gland. Then, using three-dimensional radiation planning tools I reconstructed the prostate in view of the structures from the transperineal needle pathway to assess for possible pubic arch interference. In doing so, I did not appreciate any pubic arch interference. Also, the patient's prostate volume was estimated based on the drawn structure. The volume was 36 cc.  Given the pubic arch appearance and prostate volume, patient remains a good candidate to proceed with  prostate seed implant. Today, he freely provided informed written consent to proceed.    PLAN: The patient will undergo prostate seed implant.   ________________________________  Sheral Apley. Tammi Klippel, M.D.

## 2020-11-30 NOTE — Telephone Encounter (Signed)
Called patient to remind of pre-seed appts. for 12-01-20, spoke with patient and he is aware of these appts.

## 2020-12-01 ENCOUNTER — Ambulatory Visit (HOSPITAL_COMMUNITY)
Admission: RE | Admit: 2020-12-01 | Discharge: 2020-12-01 | Disposition: A | Discharge status: Home / Self Care | Payer: PPO | Source: Ambulatory Visit | Attending: Urology | Admitting: Urology

## 2020-12-01 ENCOUNTER — Ambulatory Visit
Admission: RE | Admit: 2020-12-01 | Discharge: 2020-12-01 | Disposition: A | Discharge status: Home / Self Care | Payer: PPO | Source: Ambulatory Visit | Attending: Radiation Oncology | Admitting: Radiation Oncology

## 2020-12-01 ENCOUNTER — Other Ambulatory Visit: Payer: Self-pay

## 2020-12-01 ENCOUNTER — Encounter: Payer: Self-pay | Admitting: Urology

## 2020-12-01 ENCOUNTER — Encounter (HOSPITAL_COMMUNITY)
Admission: RE | Admit: 2020-12-01 | Discharge: 2020-12-01 | Disposition: A | Discharge status: Home / Self Care | Payer: PPO | Source: Ambulatory Visit | Attending: Urology | Admitting: Urology

## 2020-12-01 ENCOUNTER — Ambulatory Visit
Admission: RE | Admit: 2020-12-01 | Discharge: 2020-12-01 | Disposition: A | Discharge status: Home / Self Care | Payer: PPO | Source: Ambulatory Visit | Attending: Urology | Admitting: Urology

## 2020-12-01 DIAGNOSIS — Z01818 Encounter for other preprocedural examination: Secondary | ICD-10-CM | POA: Diagnosis not present

## 2020-12-01 DIAGNOSIS — C61 Malignant neoplasm of prostate: Secondary | ICD-10-CM

## 2020-12-01 DIAGNOSIS — J45909 Unspecified asthma, uncomplicated: Secondary | ICD-10-CM | POA: Diagnosis not present

## 2020-12-01 DIAGNOSIS — D869 Sarcoidosis, unspecified: Secondary | ICD-10-CM | POA: Diagnosis not present

## 2020-12-01 NOTE — Progress Notes (Signed)
Patient in fro pre seed appointment denies any pain or issues at this time.

## 2021-01-23 ENCOUNTER — Telehealth: Payer: Self-pay | Admitting: *Deleted

## 2021-01-23 NOTE — Telephone Encounter (Signed)
CALLED PATIENT TO INFORM OF LAB APPT. FOR 02-07-21 @ 8:30 AM, LVM FOR A RETURN CALL

## 2021-02-06 ENCOUNTER — Other Ambulatory Visit: Payer: Self-pay

## 2021-02-06 ENCOUNTER — Encounter (HOSPITAL_BASED_OUTPATIENT_CLINIC_OR_DEPARTMENT_OTHER): Payer: Self-pay | Admitting: Urology

## 2021-02-06 NOTE — Progress Notes (Signed)
Spoke w/ via phone for pre-op interview---pt Lab needs dos----    none lab appt 02-07-2021 at 830 am for cbc cmp pt ptt          Lab results------chest xray and ekg done 12-01-2020 on chart./epic COVID test ---- 02-07-2021 [place]  [place] 30 days Arrive at -------1015 am 02-10-2021 NPO after MN NO Solid Food.  Clear liquids from MN until---915 am Med rec completed Medications to take morning of surgery -----allegra d Diabetic medication -----n/a Patient instructed to bring photo id and insurance card day of surgery Patient aware to have Driver (ride ) / caregiver  wife gail will stay   for 24 hours after surgery  Patient Special Instructions -----fleets enema am of sugrery Pre-Op special Istructions -----none Patient verbalized understanding of instructions that were given at this phone interview. Patient denies shortness of breath, chest pain, fever, cough at this phone interview.

## 2021-02-07 ENCOUNTER — Encounter (HOSPITAL_COMMUNITY)
Admission: RE | Admit: 2021-02-07 | Discharge: 2021-02-07 | Disposition: A | Discharge status: Home / Self Care | Payer: PPO | Source: Ambulatory Visit | Attending: Urology | Admitting: Urology

## 2021-02-07 ENCOUNTER — Other Ambulatory Visit: Payer: Self-pay | Admitting: Urology

## 2021-02-07 DIAGNOSIS — Z20822 Contact with and (suspected) exposure to covid-19: Secondary | ICD-10-CM | POA: Insufficient documentation

## 2021-02-07 DIAGNOSIS — C61 Malignant neoplasm of prostate: Secondary | ICD-10-CM | POA: Insufficient documentation

## 2021-02-07 DIAGNOSIS — Z01812 Encounter for preprocedural laboratory examination: Secondary | ICD-10-CM | POA: Diagnosis not present

## 2021-02-07 LAB — COMPREHENSIVE METABOLIC PANEL
ALT: 21 U/L (ref 0–44)
AST: 19 U/L (ref 15–41)
Albumin: 4.1 g/dL (ref 3.5–5.0)
Alkaline Phosphatase: 49 U/L (ref 38–126)
Anion gap: 9 (ref 5–15)
BUN: 25 mg/dL — ABNORMAL HIGH (ref 8–23)
CO2: 27 mmol/L (ref 22–32)
Calcium: 9.2 mg/dL (ref 8.9–10.3)
Chloride: 103 mmol/L (ref 98–111)
Creatinine, Ser: 0.92 mg/dL (ref 0.61–1.24)
GFR, Estimated: >60 mL/min (ref >60)
Glucose, Bld: 112 mg/dL — ABNORMAL HIGH (ref 70–99)
Potassium: 4.9 mmol/L (ref 3.5–5.1)
Sodium: 139 mmol/L (ref 135–145)
Total Bilirubin: 0.8 mg/dL (ref 0.3–1.2)
Total Protein: 6.5 g/dL (ref 6.5–8.1)

## 2021-02-07 LAB — PROTIME-INR
INR: 1 (ref 0.8–1.2)
Prothrombin Time: 13.1 seconds (ref 11.4–15.2)

## 2021-02-07 LAB — CBC
HCT: 41.6 % (ref 39.0–52.0)
Hemoglobin: 14.1 g/dL (ref 13.0–17.0)
MCH: 32.3 pg (ref 26.0–34.0)
MCHC: 33.9 g/dL (ref 30.0–36.0)
MCV: 95.4 fL (ref 80.0–100.0)
Platelets: 302 K/uL (ref 150–400)
RBC: 4.36 MIL/uL (ref 4.22–5.81)
RDW: 12.6 % (ref 11.5–15.5)
WBC: 6.2 K/uL (ref 4.0–10.5)
nRBC: 0 % (ref 0.0–0.2)

## 2021-02-07 LAB — APTT: aPTT: 27 s (ref 24–36)

## 2021-02-07 LAB — SARS CORONAVIRUS 2 (TAT 6-24 HRS): SARS Coronavirus 2: NEGATIVE

## 2021-02-09 ENCOUNTER — Telehealth: Payer: Self-pay | Admitting: *Deleted

## 2021-02-09 NOTE — H&P (Signed)
H&P  Chief Complaint: Prostate cancer  History of Present Illness: Mr. Monforte is a 70 year old male who has been undergoing active surveillance for a low risk prostate cancer with Gleason 3+3 = 6 disease since September 2019.  A nodule on exam first drew our attention to the prostate but his exam over the years since have been benign.  PSA has been low around 2.35.  He underwent a surveillance biopsy in 2020 which remained low risk.  He underwent a surveillance prostate MRI December 2021 with a PI-RADS 4 lesion right anterior and right peripheral zone.  Therefore he underwent a surveillance fusion biopsy May 2022 which revealed an increased core number and percentages.  He had Gleason 3+3 = 6 in 1 core on the right with 60% and Gleason 3+4 = 7 in 3 cores on the right 5 to 10%.  He also had 3+4  = 7 in 1 core of the right peripheral zone region of interest at 50% and 2 cores with 50 to 60% of Gleason 3+3 = 6. The right anterior region of interest was negative.  His prostate measured 38 g and AUA symptom score is around 4.  He presents today for brachytherapy and biodegradable gel injection.  He is doing well without dysuria or gross hematuria.   Past Medical History:  Diagnosis Date   Arthritis    oa in hands   Asthma    occasional allergy related   Atypical nevus 05/03/2005   Left Low Back - Moderate and Right Mid Back - Slight   Atypical nevus 06/04/2007   Back Right Thigh Inf - Slight to Moderate and Back Right Thigh Sup - Slight   Atypical nevus 06/12/2010   Right Lower Abdomen - Mild   BCC (basal cell carcinoma of skin) 05/03/2005   Right Forehead ([place])   DVT of lower limb, acute ([place]) 12/2008   right leg   History of hiatal hernia    Nodulo-ulcerative basal cell carcinoma (BCC) 12/11/2016   Behind Left Jawline (CX3+5FU)   Prostate cancer ([place])    Sarcoidosis    no current pulmonary md, followed by dr Melinda Crutch for   [facility] (squamous cell carcinoma) 12/15/2015   Right Outer Shin    Squamous cell carcinoma of skin 07/14/2019   right shin - CX3+5FU   Wears glasses    Past Surgical History:  Procedure Laterality Date   5th metatarsal repair Right 2010   KNEE ARTHROSCOPY Right 06/24/2013   Procedure: RIGHT KNEE ARTHROSCOPY WITH DEBRIDEMENT ;  Surgeon: Gearlean Alf, MD;  Location: [facility];  Service: Orthopedics;  Laterality: Right;   left 5th metatarsal repair with screw placement  06/2020   [facility] [place]   PROSTATE BIOPSY      Home Medications:  No medications prior to admission.   Allergies: No Known Allergies  Family History  Problem Relation Age of Onset   Breast cancer Neg Hx    Colon cancer Neg Hx    Prostate cancer Neg Hx    Pancreatic cancer Neg Hx    Social History:  reports that he has never smoked. He has never used smokeless tobacco. He reports current alcohol use. He reports that he does not use drugs.  ROS: A complete review of systems was performed.  All systems are negative except for pertinent findings as noted. Review of Systems  All other systems reviewed and are negative.   Physical Exam:  Vital signs in last 24 hours:   General:  Alert and oriented, No acute distress HEENT: Normocephalic, atraumatic Cardiovascular: Regular rate and rhythm Lungs: Regular rate and effort Abdomen: Soft, nontender, nondistended, no abdominal masses Back: No CVA tenderness Extremities: No edema Neurologic: Grossly intact  Laboratory Data:  No results found for this or any previous visit (from the past 24 hour(s)). Recent Results (from the past 240 hour(s))  SARS Coronavirus 2 (TAT 6-24 hrs)     Status: None   Collection Time: 02/07/21 12:00 AM  Result Value Ref Range Status   SARS Coronavirus 2 RESULT: NEGATIVE  Final    Comment: RESULT: NEGATIVESARS-CoV-2 INTERPRETATION:A NEGATIVE  test result means that SARS-CoV-2 RNA was not present in the specimen above the limit of detection of this test. This does not preclude a  possible SARS-CoV-2 infection and should not be used as the  sole basis for patient management decisions. Negative results must be combined with clinical observations, patient history, and epidemiological information. Optimum specimen types and timing for peak viral levels during infections caused by SARS-CoV-2  have not been determined. Collection of multiple specimens or types of specimens may be necessary to detect virus. Improper specimen collection and handling, sequence variability under primers/probes, or organism present below the limit of detection may  lead to false negative results. Positive and negative predictive values of testing are highly dependent on prevalence. False negative test results are more likely when prevalence of disease is high.The expected result is NEGATIVE.Fact S heet for  Healthcare Providers: LocalChronicle.no Sheet for Patients: SalonLookup.es Reference Range - Negative    Creatinine: Recent Labs    02/07/21 0852  CREATININE 0.92    Impression/Assessment/plan:  Prostate cancer - .I discussed with the patient the nature, potential benefits, risks and alternatives to prostate permanent low dose brachytherapy seeds, SpaceOar gel and cystoscopy, including side effects of the proposed treatment, the likelihood of the patient achieving the goals of the procedure, and any potential problems that might occur during the procedure or recuperation. All questions answered. Patient elects to proceed.    Festus Aloe 02/09/2021, 12:34 PM

## 2021-02-09 NOTE — Telephone Encounter (Signed)
CALLED PATIENT TO REMIND OF PROCEDURE FOR 02-10-21, SPOKE WITH PATIENT AND HE IS AWARE OF THIS PROCEDURE

## 2021-02-10 ENCOUNTER — Encounter (HOSPITAL_BASED_OUTPATIENT_CLINIC_OR_DEPARTMENT_OTHER)
Admission: RE | Disposition: A | Discharge status: Home / Self Care | Payer: Self-pay | Source: Ambulatory Visit | Attending: Urology

## 2021-02-10 ENCOUNTER — Ambulatory Visit (HOSPITAL_BASED_OUTPATIENT_CLINIC_OR_DEPARTMENT_OTHER): Payer: PPO | Admitting: Anesthesiology

## 2021-02-10 ENCOUNTER — Encounter (HOSPITAL_BASED_OUTPATIENT_CLINIC_OR_DEPARTMENT_OTHER): Payer: Self-pay | Admitting: Urology

## 2021-02-10 ENCOUNTER — Ambulatory Visit (HOSPITAL_COMMUNITY): Payer: PPO

## 2021-02-10 ENCOUNTER — Ambulatory Visit (HOSPITAL_BASED_OUTPATIENT_CLINIC_OR_DEPARTMENT_OTHER)
Admission: RE | Admit: 2021-02-10 | Discharge: 2021-02-10 | Disposition: A | Discharge status: Home / Self Care | Payer: PPO | Source: Ambulatory Visit | Attending: Urology | Admitting: Urology

## 2021-02-10 DIAGNOSIS — C61 Malignant neoplasm of prostate: Secondary | ICD-10-CM | POA: Diagnosis not present

## 2021-02-10 DIAGNOSIS — M19041 Primary osteoarthritis, right hand: Secondary | ICD-10-CM | POA: Diagnosis not present

## 2021-02-10 DIAGNOSIS — D869 Sarcoidosis, unspecified: Secondary | ICD-10-CM | POA: Diagnosis not present

## 2021-02-10 DIAGNOSIS — M19042 Primary osteoarthritis, left hand: Secondary | ICD-10-CM | POA: Diagnosis not present

## 2021-02-10 HISTORY — PX: SPACE OAR INSTILLATION: SHX6769

## 2021-02-10 HISTORY — DX: Personal history of other diseases of the digestive system: Z87.19

## 2021-02-10 HISTORY — PX: RADIOACTIVE SEED IMPLANT: SHX5150

## 2021-02-10 HISTORY — DX: Presence of spectacles and contact lenses: Z97.3

## 2021-02-10 SURGERY — INSERTION, RADIATION SOURCE, PROSTATE
Anesthesia: General | Site: Prostate

## 2021-02-10 MED ORDER — HYDROCODONE-ACETAMINOPHEN 7.5-325 MG PO TABS: 1.0000 | ORAL_TABLET | Freq: Four times a day (QID) | ORAL | 0 refills | Status: DC | PRN

## 2021-02-10 MED ORDER — SODIUM CHLORIDE (PF) 0.9 % IJ SOLN: INTRAMUSCULAR | Status: DC | PRN

## 2021-02-10 MED ORDER — TAMSULOSIN HCL 0.4 MG PO CAPS: 0.4000 mg | ORAL_CAPSULE | Freq: Every day | ORAL | 0 refills | Status: AC

## 2021-02-10 MED ORDER — FENTANYL CITRATE (PF) 100 MCG/2ML IJ SOLN
INTRAMUSCULAR | Status: DC | PRN
  Administered 2021-02-10 (×2): 50 ug via INTRAVENOUS

## 2021-02-10 MED ORDER — SODIUM CHLORIDE 0.9 % IV SOLN
INTRAVENOUS | Status: AC | PRN
  Administered 2021-02-10: 1000 mL

## 2021-02-10 MED ORDER — ACETAMINOPHEN 10 MG/ML IV SOLN: 1000.0000 mg | Freq: Once | INTRAVENOUS | Status: DC | PRN

## 2021-02-10 MED ORDER — CIPROFLOXACIN IN D5W 400 MG/200ML IV SOLN
400.0000 mg | INTRAVENOUS | Status: AC
  Administered 2021-02-10: 400 mg via INTRAVENOUS

## 2021-02-10 MED ORDER — IOHEXOL 300 MG/ML  SOLN
INTRAMUSCULAR | Status: DC | PRN
  Administered 2021-02-10: 7 mL

## 2021-02-10 MED ORDER — ONDANSETRON HCL 4 MG/2ML IJ SOLN: 4.0000 mg | Freq: Once | INTRAMUSCULAR | Status: DC | PRN

## 2021-02-10 MED ORDER — FENTANYL CITRATE (PF) 100 MCG/2ML IJ SOLN
INTRAMUSCULAR | Status: AC
  Filled 2021-02-10: qty 2

## 2021-02-10 MED ORDER — OXYCODONE HCL 5 MG/5ML PO SOLN: 5.0000 mg | Freq: Once | ORAL | Status: DC | PRN

## 2021-02-10 MED ORDER — PROPOFOL 10 MG/ML IV BOLUS
INTRAVENOUS | Status: AC
  Filled 2021-02-10: qty 20

## 2021-02-10 MED ORDER — FENTANYL CITRATE (PF) 100 MCG/2ML IJ SOLN: 25.0000 ug | INTRAMUSCULAR | Status: DC | PRN

## 2021-02-10 MED ORDER — ONDANSETRON HCL 4 MG/2ML IJ SOLN
INTRAMUSCULAR | Status: DC | PRN
  Administered 2021-02-10: 4 mg via INTRAVENOUS

## 2021-02-10 MED ORDER — SODIUM CHLORIDE FLUSH 0.9 % IV SOLN
INTRAVENOUS | Status: DC | PRN
  Administered 2021-02-10: 10 mL

## 2021-02-10 MED ORDER — LIDOCAINE 2% (20 MG/ML) 5 ML SYRINGE
INTRAMUSCULAR | Status: DC | PRN
  Administered 2021-02-10: 100 mg via INTRAVENOUS

## 2021-02-10 MED ORDER — ONDANSETRON HCL 4 MG/2ML IJ SOLN
INTRAMUSCULAR | Status: AC
  Filled 2021-02-10: qty 2

## 2021-02-10 MED ORDER — OXYCODONE HCL 5 MG PO TABS: 5.0000 mg | ORAL_TABLET | Freq: Once | ORAL | Status: DC | PRN

## 2021-02-10 MED ORDER — DEXAMETHASONE SODIUM PHOSPHATE 10 MG/ML IJ SOLN
INTRAMUSCULAR | Status: AC
  Filled 2021-02-10: qty 1

## 2021-02-10 MED ORDER — FLEET ENEMA 7-19 GM/118ML RE ENEM: 1.0000 | ENEMA | Freq: Once | RECTAL | Status: DC

## 2021-02-10 MED ORDER — PROPOFOL 10 MG/ML IV BOLUS
INTRAVENOUS | Status: DC | PRN
  Administered 2021-02-10: 200 mg via INTRAVENOUS
  Administered 2021-02-10: 50 mg via INTRAVENOUS

## 2021-02-10 MED ORDER — STERILE WATER FOR IRRIGATION IR SOLN
Status: DC | PRN
  Administered 2021-02-10: 1

## 2021-02-10 MED ORDER — DEXAMETHASONE SODIUM PHOSPHATE 10 MG/ML IJ SOLN
INTRAMUSCULAR | Status: DC | PRN
  Administered 2021-02-10: 4 mg via INTRAVENOUS

## 2021-02-10 MED ORDER — CIPROFLOXACIN IN D5W 400 MG/200ML IV SOLN
INTRAVENOUS | Status: AC
  Filled 2021-02-10: qty 200

## 2021-02-10 MED ORDER — LIDOCAINE HCL (PF) 2 % IJ SOLN
INTRAMUSCULAR | Status: AC
  Filled 2021-02-10: qty 5

## 2021-02-10 MED ORDER — LACTATED RINGERS IV SOLN: INTRAVENOUS | Status: DC

## 2021-02-10 SURGICAL SUPPLY — 35 items
BAG DRN RND TRDRP ANRFLXCHMBR (UROLOGICAL SUPPLIES) ×2
BAG URINE DRAIN 2000ML AR STRL (UROLOGICAL SUPPLIES) ×3 IMPLANT
BLADE CLIPPER SENSICLIP SURGIC (BLADE) ×3 IMPLANT
CATH FOLEY 2WAY SLVR  5CC 16FR (CATHETERS) ×3
CATH FOLEY 2WAY SLVR 5CC 16FR (CATHETERS) ×2 IMPLANT
CATH ROBINSON RED A/P 16FR (CATHETERS) IMPLANT
CATH ROBINSON RED A/P 20FR (CATHETERS) ×3 IMPLANT
CLOTH BEACON ORANGE TIMEOUT ST (SAFETY) ×3 IMPLANT
CNTNR URN SCR LID CUP LEK RST (MISCELLANEOUS) ×4 IMPLANT
CONT SPEC 4OZ STRL OR WHT (MISCELLANEOUS) ×6
COVER BACK TABLE 60X90IN (DRAPES) ×3 IMPLANT
COVER MAYO STAND STRL (DRAPES) ×3 IMPLANT
DRSG TEGADERM 4X4.75 (GAUZE/BANDAGES/DRESSINGS) ×5 IMPLANT
DRSG TEGADERM 8X12 (GAUZE/BANDAGES/DRESSINGS) ×6 IMPLANT
GAUZE SPONGE 4X4 8PLY NS (GAUZE/BANDAGES/DRESSINGS) ×1 IMPLANT
GLOVE SURG ENC MOIS LTX SZ6.5 (GLOVE) ×3 IMPLANT
GLOVE SURG ENC MOIS LTX SZ7.5 (GLOVE) ×3 IMPLANT
GLOVE SURG ENC MOIS LTX SZ8 (GLOVE) IMPLANT
GLOVE SURG ORTHO LTX SZ8.5 (GLOVE) ×3 IMPLANT
GLOVE SURG UNDER POLY LF SZ6.5 (GLOVE) IMPLANT
GOWN STRL REUS W/TWL LRG LVL3 (GOWN DISPOSABLE) ×3 IMPLANT
HOLDER FOLEY CATH W/STRAP (MISCELLANEOUS) ×3 IMPLANT
IMPL SPACEOAR VUE SYSTEM (Spacer) ×2 IMPLANT
IMPLANT SPACEOAR VUE SYSTEM (Spacer) ×3 IMPLANT
IV NS 1000ML (IV SOLUTION) ×3
IV NS 1000ML BAXH (IV SOLUTION) ×2 IMPLANT
KIT TURNOVER CYSTO (KITS) ×3 IMPLANT
PACK CYSTO (CUSTOM PROCEDURE TRAY) ×3 IMPLANT
SURGILUBE 2OZ TUBE FLIPTOP (MISCELLANEOUS) ×3 IMPLANT
SUT BONE WAX W31G (SUTURE) IMPLANT
SYR 10ML LL (SYRINGE) ×3 IMPLANT
TOWEL OR 17X26 10 PK STRL BLUE (TOWEL DISPOSABLE) ×3 IMPLANT
UNDERPAD 30X36 HEAVY ABSORB (UNDERPADS AND DIAPERS) ×6 IMPLANT
WATER STERILE IRR 500ML POUR (IV SOLUTION) ×3 IMPLANT
i-Seed AgX100 ×69 IMPLANT

## 2021-02-10 NOTE — Transfer of Care (Signed)
Immediate Anesthesia Transfer of Care Note  Patient: [name]  Procedure(s) Performed: RADIOACTIVE SEED IMPLANT/BRACHYTHERAPY IMPLANT (Prostate) SPACE OAR INSTILLATION (Perineum)  Patient Location: PACU  Anesthesia Type:General  Level of Consciousness: awake, alert  and oriented  Airway & Oxygen Therapy: Patient Spontanous Breathing and Patient connected to nasal cannula oxygen  Post-op Assessment: Report given to RN  Post vital signs: Reviewed and stable  Last Vitals:  Vitals Value Taken Time  BP 126/73 02/10/21 1400  Temp    Pulse 78 02/10/21 1400  Resp 11 02/10/21 1400  SpO2 97 % 02/10/21 1400  Vitals shown include unvalidated device data.  Last Pain:  Vitals:   02/10/21 1043  TempSrc: Oral      Patients Stated Pain Goal: 4 (86/76/19 5093)  Complications: No notable events documented.

## 2021-02-10 NOTE — Anesthesia Procedure Notes (Signed)
Procedure Name: LMA Insertion Date/Time: 02/10/2021 12:34 PM Performed by: Bonney Aid, CRNA Pre-anesthesia Checklist: Patient identified, Emergency Drugs available, Suction available and Patient being monitored Patient Re-evaluated:Patient Re-evaluated prior to induction Oxygen Delivery Method: Circle system utilized Preoxygenation: Pre-oxygenation with 100% oxygen Induction Type: IV induction Ventilation: Mask ventilation without difficulty LMA: LMA inserted LMA Size: 5.0 Number of attempts: 1 Airway Equipment and Method: Bite block Placement Confirmation: positive ETCO2 Tube secured with: Tape Dental Injury: Teeth and Oropharynx as per pre-operative assessment

## 2021-02-10 NOTE — Anesthesia Postprocedure Evaluation (Signed)
Anesthesia Post Note  Patient: [name]  Procedure(s) Performed: RADIOACTIVE SEED IMPLANT/BRACHYTHERAPY IMPLANT (Prostate) SPACE OAR INSTILLATION (Perineum)     Patient location during evaluation: PACU Anesthesia Type: General Level of consciousness: awake and alert Pain management: pain level controlled Vital Signs Assessment: post-procedure vital signs reviewed and stable Respiratory status: spontaneous breathing, nonlabored ventilation, respiratory function stable and patient connected to nasal cannula oxygen Cardiovascular status: blood pressure returned to baseline and stable Postop Assessment: no apparent nausea or vomiting Anesthetic complications: no   No notable events documented.  Last Vitals:  Vitals:   02/10/21 1415 02/10/21 1430  BP: 125/75   Pulse: 79 79  Resp: 17 18  Temp:    SpO2: 100% 97%    Last Pain:  Vitals:   02/10/21 1430  TempSrc:   PainSc: 0-No pain                 Pantera Winterrowd S

## 2021-02-10 NOTE — Progress Notes (Signed)
Instructions completed at 1500.

## 2021-02-10 NOTE — Discharge Instructions (Addendum)
Brachytherapy for Prostate Cancer, Care After The following information offers guidance on how to care for yourself after your procedure. Your health care provider may also give you more specific instructions. If you have problems or questions, contact your health care provider. What can I expect after the procedure? After the procedure, it is common to have: Urinary symptoms. These may include: Trouble urinating. Blood in the urine or semen. Frequent feeling of an urgent need to urinate. Constipation, nausea, or bloating and gas. Bruising, swelling, and tenderness of the area beneath the scrotum (perineum). Tiredness (fatigue). Burning or pain in the rectum. Problems getting or keeping an erection (erectile dysfunction). After the thin, flexible urinary tube (Foley catheter)is removed, it is common to have: Burning when you urinate. This may last for up to 2 weeks after the catheter is removed. Trouble controlling when you urinate (incontinence). You may leak urine sometimes. Your ability to control when you urinate should improve as you heal. Follow these instructions at home: Eating and drinking Follow instructions from your health care provider about eating or drinking restrictions. Drink enough fluid to keep your urine pale yellow. Treatment area care Check your treatment area every day for signs of infection. Watch for: Redness, swelling, or pain. New drainage or blood. Some dried fluid or blood may be present. Warmth. Pus or a bad smell. Managing pain and swelling If directed, put ice on the affected area. To do this: Put ice in a plastic bag. Place a towel between your skin and the bag. Leave the ice on for 20 minutes at a time, 2-3 times a day. Be sure to remove the ice if your skin turns bright red. If you cannot feel pain, heat, or cold, you have a greater risk of damage to the area. Try not to sit directly on the perineum. A soft cushion can help with  discomfort. Activity Do not lift anything that is heavier than 10 lb (4.5 kg), or the limit that you are told, until your health care provider says that it is safe. Rest as told by your health care provider. Return to your normal activities as told by your health care provider. Ask your health care provider what activities are safe for you. Most people can return to normal activities within a few days. If you were given a sedative during the procedure, it can affect you for several hours. Do not drive or operate machinery until your health care provider says that it is safe. Follow instructions from your health care provider about when it is safe for you to engage in sexual activity. Managing constipation Your procedure may cause constipation. To prevent or treat constipation, you may need to: Take over-the-counter or prescription medicines. Eat foods that are high in fiber, such as beans, whole grains, and fresh fruits and vegetables. Limit foods that are high in fat and processed sugars, such as fried or sweet foods. General instructions Take over-the-counter and prescription medicines only as told by your health care provider. Do not take baths, swim, or use a hot tub until your health care provider approves. You may take a shower and wash the perineum gently. Do not strain to have a bowel movement. Do not use any products that contain nicotine or tobacco. These products include cigarettes, chewing tobacco, and vaping devices, such as e-cigarettes. If you need help quitting, ask your health care provider. If you have permanent, low-dose brachytherapy implants: Limit close contact with children and pregnant women for 2 months or as told  by your health care provider. This is important because of the active radiation in your prostate. You may set off radioactive sensors, such as at airport screenings. Ask your health care provider for a document that explains your treatment. You may be told to  use a condom during sex for the first 2 months after low-dose brachytherapy. In rare cases, a seed can move and come out in semen. A condom keeps it from going into your partner. Keep all follow-up visits because you may need more treatment. Contact a health care provider if you: Have a fever or chills. Have any of these signs of infection in the treatment area: Redness, swelling, or pain that is new or gets worse. Fluid or blood. Warmth. Pus or a bad smell. Have no bowel movements for 3-4 days after the procedure. Have diarrhea for 3-4 days after the procedure. Develop any new symptoms, such as problems passing urine or erectile dysfunction. Have pain in your abdomen. Have more blood in your urine, have urine that is cloudy or smells bad, or have pain or burning when you urinate. Have swelling or pain in your legs. Get help right away if: You cannot urinate. You have a lot of bleeding from your rectum. You have unusual drainage coming from your rectum. You have pain in the treated area that does not go away with pain medicine or gets worse. You have severe nausea or vomiting. You have trouble breathing. Summary Talk with your health care provider about your risk of brachytherapy side effects, such as erectile dysfunction or urinary problems. If you have permanent, low-dose brachytherapy implants, limit close contact with children and pregnant women for 2 months or as told by your health care provider. You may be told to use a condom during sex for the first 2 months after low-dose brachytherapy. Keep all follow-up visits because you may need more treatment. This information is not intended to replace advice given to you by your health care provider. Make sure you discuss any questions you have with your health care provider. Document Revised: 07/27/2020 Document Reviewed: 07/27/2020 Elsevier Patient Education  [place] TREATMENT WITH RADIOACTIVE IODINE-125  SEED IMPLANT  This instruction sheet is intended to discuss implantation of Iodine-125 seeds as treatment for cancer of the prostate. It will explain in detail what you may expect from this treatment and what precautions are necessary as a result of the treatment. Iodine-125 emits a relatively low energy radiation. The radioactive seeds are surgically implanted directly into the prostate gland. Most of the radiation is contained within the prostate gland. A very small amount is present outside the body.The precautions that we ask you to take are to ensure that those around you are protected from unnecessary radiation. The principles of radiation safety that you need to understand are:  DISTANCE: The further a person is from the radioactive implant the less radiation they will be receiving. The amount of radiation received falls off quite rapidly with distance. More specific guidelines are given in the table on the last page.  TIME: The amount of radiation a person is exposed to is directly proportional to the amount of time that is spent in close proximity to the radioactive implant. Very little radiation will be received during short periods. See the table on the last page for more specific guideline.  CHILDREN UNDER AGE 29 Children should not be allowed to sit on your lap or otherwise be in very close contact for more than a  few minutes for the first 6-8 weeks following the implant. You may affectionately greet (hug/kiss) a child for a short period of time, but remember, the longer you are in close proximity with that child the more radiation they are being exposed to. At a distance of 6 feet there is no limit to the length of time you may spend together. See specific guidelines on the last page.  PREGNANT OR POSSIBLY PREGNANT WOMEN Pregnant women should avoid prolonged close physical contact with you for the first 6-8 weeks after implant. At a distance of 6 feet there is no limit to the length of  time you may spend together. Pregnant women or possibly pregnant women can safely be in close contact with you for a limited period of time. See the last page for guidelines.  FAMILY RELATIONS You may sleep in the same bed as your partner (provided she is not pregnant or under the age of 81). Sexual intercourse, using a condom, may be resumed 2 weeks after the implant. Your semen may be discolored, dark brown or black. This is normal and is the result of bleeding that may have occurred during the implant. After 3-4 weeks it will not be necessary to use a condom.  DAILY ACTIVITIES You may resume normal activities in a few days (example: work, shopping, church) without the risk of harmful radiation exposure to those around you provided you keep in mind the time and distance precautions. Objects that you touch or item that you use do not become radioactive. Linens, clothing, tableware, and dishes may be used by other persons without special precautions. Your bodily wastes (urine and stool) are not radioactive.  SPECIAL PRECAUTIONS It is possible to lose implanted Iodine-125 seed(s) through urination. Although it is possible to pass seeds indefinitely, it is most likely to occur immediately after catheter removal. To prevent this from happening the catheter that was in place during the implant procedure is removed immediately after the implant and a cystoscopy procedure is performed. The process of removing the catheter and the cystoscopy procedure should dislodge and remove any seeds that are not firmly imbedded in the prostate tissue. However, you should watch for seeds if/when you remove your catheter at home. The seeds are silver colored and the size of a grain of rice. In the unlikely event that a seed is seen after urination, simply flush the seed down the toilet. The seed should not be handled with your fingers, not even with a glove or napkin. A spoon or tweezers can be used to pick up a seed. The  Radiation Oncology department is open Monday - Friday from 8:00 am to 5:30 pm with a Radiation Oncologist on call at all times. He or she may be reached by calling [phone]. If you are to be hospitalized or if death should occur, your family should notify the Runner, broadcasting/film/video.  SIDE EFFECTS There are very few side effects associate with the implant procedure. Minor burning with urination, weak stream, hesitancy, intermittency, frequency, mild pain or feeling unable to pass your urine freely are common and usually stop in one to four months. If these symptoms are extremely uncomfortable, contact your physician.  RADIATION SAFETY GUIDELINES PROSTATE CANCER TREATMENT WITH RADIOACTIVE IODINE-125 SEED IMPLANT  The following guidelines will limit exposure to less than naturally occurring background radiation.  PERSONS AGE 41-45 (if able to become pregnant)  FOR 8 WEEKS FOLLOWING IMPLANT  At a distance of 1 foot: limit time to less than 2 hours/week  At a distance of 3 feet: limit time to 20 hours/week At a distance of 6 feet: no restrictions  AFTER 8 WEEKS No restrictions  CHILDREN UNDER AGE 20, PREGNANT WOMEN OR POSSIBLY PREGNANT WOMEN  FOR 8 WEEKS FOLLOWING IMPLANT At a distance of 1 foot: limit time to 10 minutes/week At a distance of 3 feet: limit time to 2 hours/week At a distance of 6 feet: no restrictions  AFTER 8 WEEKS No restrictions  PERSONS OVER THE AGE OF 45 AND DO NOT EXPECT TO HAVE ANY MORE CHILDREN No restrictions  Updated by [facility] in January 2020   Post Anesthesia Home Care Instructions  Activity: Get plenty of rest for the remainder of the day. A responsible individual must stay with you for 24 hours following the procedure.  For the next 24 hours, DO NOT: -Drive a car -Paediatric nurse -Drink alcoholic beverages -Take any medication unless instructed by your physician -Make any legal decisions or sign important papers.  Meals: Start with  liquid foods such as gelatin or soup. Progress to regular foods as tolerated. Avoid greasy, spicy, heavy foods. If nausea and/or vomiting occur, drink only clear liquids until the nausea and/or vomiting subsides. Call your physician if vomiting continues.  Special Instructions/Symptoms: Your throat may feel dry or sore from the anesthesia or the breathing tube placed in your throat during surgery. If this causes discomfort, gargle with warm salt water. The discomfort should disappear within 24 hours.

## 2021-02-10 NOTE — Anesthesia Preprocedure Evaluation (Signed)
Anesthesia Evaluation  Patient identified by MRN, date of birth, ID band Patient awake    Reviewed: Allergy & Precautions, NPO status , Patient's Chart, lab work & pertinent test results  Airway Mallampati: II  TM Distance: >3 FB Neck ROM: Full    Dental no notable dental hx.    Pulmonary asthma ,    Pulmonary exam normal breath sounds clear to auscultation       Cardiovascular negative cardio ROS Normal cardiovascular exam Rhythm:Regular Rate:Normal     Neuro/Psych negative neurological ROS  negative psych ROS   GI/Hepatic negative GI ROS, Neg liver ROS,   Endo/Other  negative endocrine ROS  Renal/GU negative Renal ROS  negative genitourinary   Musculoskeletal negative musculoskeletal ROS (+)   Abdominal   Peds negative pediatric ROS (+)  Hematology negative hematology ROS (+)   Anesthesia Other Findings   Reproductive/Obstetrics negative OB ROS                             Anesthesia Physical Anesthesia Plan  ASA: 2  Anesthesia Plan: General   Post-op Pain Management:    Induction: Intravenous  PONV Risk Score and Plan: 2 and Ondansetron, Dexamethasone and Treatment may vary due to age or medical condition  Airway Management Planned: LMA  Additional Equipment:   Intra-op Plan:   Post-operative Plan: Extubation in OR  Informed Consent: I have reviewed the patients History and Physical, chart, labs and discussed the procedure including the risks, benefits and alternatives for the proposed anesthesia with the patient or authorized representative who has indicated his/her understanding and acceptance.     Dental advisory given  Plan Discussed with: CRNA and Surgeon  Anesthesia Plan Comments:         Anesthesia Quick Evaluation

## 2021-02-10 NOTE — Op Note (Signed)
Preoperative diagnosis: Stage I (T1cNxMx) Prostate cancer Postoperative diagnosis: Same   Procedure: Prostate brachytherapy seed implant, Cystoscopy, SpaceOar biodegradable gel    Surgeon: Junious Silk   Radiation oncologist: Tammi Klippel   Anesthesia: Gen.   Indication for procedure: 70 -year-old with stage I prostate cancer who elected to proceed with prostate brachytherapy.   Findings: On fluoroscopic imaging there was adequate coverage of the prostate. On cystoscopy the urethra appeared normal, the prostatic urethra appeared normal, the trigone and ureteral orifices appeared normal with clear efflux. The bladder mucosa appeared normal. There were no stones, foreign bodies or seeds visualized in the bladder.   Dose: 145 Gy   Description of procedure: After consent was obtained patient brought to the operating room. After adequate anesthesia he is placed in lithotomy position and the transrectal ultrasound probe and perineal template positioned. Catheters and brachytherapy seeds were placed per Dr. Johny Shears plan. A total of 16 catheters and 69 active sources (I-125) were placed. The anchoring needles, template and ultrasound were removed. Scout flouro imaging was obtained of the implant. The Foley was removed.   The 18-gauge needle was then inserted approximately 1 to 2 cm anterior to the anal opening and directed under fluoroscopic guidance into the perirectal fat between the anterior rectal wall and the prostate capsule down to the mid-gland. Midline needle position was confirmed in the sagittal and axial views to verify the tip was in the perirectal fat.  Small amounts of saline were injected to hydrodissect the space between the prostate and the anterior rectal wall.  Axial imaging was viewed to confirm the needle was in the correct location in the mid gland and centered.  Aspiration confirmed no intravascular access.  The saline syringe was carefully disconnected maintaining the desired needle  position and the hydrogel was attached to the needle.  Under ultrasound guidance in the sagittal view a smooth continuous injection was done over about 12 seconds delivering the hydrogel into the space between the prostate and rectal wall.  The needle was withdrawn.   The patient was prepped again and cystoscopy was performed which was noted to be normal. He was awakened taken to the recovery room in stable condition.   Complications: None   Blood loss: Minimal   Specimens: None   Drains: None    Disposition: Patient stable to PACU.

## 2021-02-12 NOTE — Progress Notes (Signed)
  Radiation Oncology         [phone] ________________________________  Name: [name] MRN: [id number]  Date: 02/12/2021  DOB: 09/16/1950       Prostate Seed Implant  DP:OEUM, Dwyane Luo, MD  No ref. provider found  DIAGNOSIS:  70 y.o. gentleman with Stage T2a adenocarcinoma of the prostate with Gleason score of 3+4, and PSA of 2.35  Oncology History  Malignant neoplasm of prostate ([place] II)  09/21/2020 Cancer Staging   Staging form: Prostate, AJCC 8th Edition - Clinical stage from 09/21/2020: Stage IIB (cT2a, cN0, cM0, PSA: 2.4, Grade Group: 2) - Signed by Freeman Caldron, PA-C on 10/11/2020 Histopathologic type: Adenocarcinoma, NOS Prostate specific antigen (PSA) range: Less than 10 Gleason primary pattern: 3 Gleason secondary pattern: 4 Gleason score: 7 Histologic grading system: 5 grade system Number of biopsy cores examined: 18 Number of biopsy cores positive: 7 Location of positive needle core biopsies: One side   10/11/2020 Initial Diagnosis   Malignant neoplasm of prostate ([place])       ICD-10-CM   1. Malignant neoplasm of prostate [facility])  C61 Discharge patient      PROCEDURE: Insertion of radioactive I-125 seeds into the prostate gland.  RADIATION DOSE: 145 Gy, definitive therapy.  TECHNIQUE: [name] was brought to the operating room with the urologist. He was placed in the dorsolithotomy position. He was catheterized and a rectal tube was inserted. The perineum was shaved, prepped and draped. The ultrasound probe was then introduced into the rectum to see the prostate gland.  TREATMENT DEVICE: A needle grid was attached to the ultrasound probe stand and anchor needles were placed.  3D PLANNING: The prostate was imaged in 3D using a sagittal sweep of the prostate probe. These images were transferred to the planning computer. There, the prostate, urethra and rectum were defined on each axial reconstructed image. Then, the software created an optimized 3D plan  and a few seed positions were adjusted. The quality of the plan was reviewed using [facility] information for the target and the following two organs at risk:  Urethra and Rectum.  Then the accepted plan was printed and handed off to the radiation therapist.  Under my supervision, the custom loading of the seeds and spacers was carried out and loaded into sealed vicryl sleeves.  These pre-loaded needles were then placed into the needle holder.Marland Kitchen  PROSTATE VOLUME STUDY:  Using transrectal ultrasound the volume of the prostate was verified to be 41.7 cc.  SPECIAL TREATMENT PROCEDURE/SUPERVISION AND HANDLING: The pre-loaded needles were then delivered under sagittal guidance. A total of 16 needles were used to deposit 69 seeds in the prostate gland. The individual seed activity was 0.446 mCi.  SpaceOAR:  Yes  COMPLEX SIMULATION: At the end of the procedure, an anterior radiograph of the pelvis was obtained to document seed positioning and count. Cystoscopy was performed to check the urethra and bladder.  MICRODOSIMETRY: At the end of the procedure, the patient was emitting 0.158 mR/hr at 1 meter. Accordingly, he was considered safe for hospital discharge.  PLAN: The patient will return to the radiation oncology clinic for post implant CT dosimetry in three weeks.   ________________________________  Sheral Apley Tammi Klippel, M.D.

## 2021-02-13 ENCOUNTER — Encounter (HOSPITAL_BASED_OUTPATIENT_CLINIC_OR_DEPARTMENT_OTHER): Payer: Self-pay | Admitting: Urology

## 2021-03-01 ENCOUNTER — Telehealth: Payer: Self-pay | Admitting: *Deleted

## 2021-03-01 NOTE — Telephone Encounter (Signed)
CALLED PATIENT TO REMIND OF HIS POST SEED APPTS. FOR 03-03-21, SPOKE WITH THIS PATIENT AND HE IS AWARE OF THESE APPTS.

## 2021-03-03 ENCOUNTER — Ambulatory Visit
Admission: RE | Admit: 2021-03-03 | Discharge: 2021-03-03 | Disposition: A | Discharge status: Home / Self Care | Payer: PPO | Source: Ambulatory Visit | Attending: Radiation Oncology | Admitting: Radiation Oncology

## 2021-03-03 ENCOUNTER — Other Ambulatory Visit: Payer: Self-pay | Admitting: Urology

## 2021-03-03 ENCOUNTER — Other Ambulatory Visit: Payer: Self-pay

## 2021-03-03 ENCOUNTER — Encounter: Payer: Self-pay | Admitting: Urology

## 2021-03-03 VITALS — BP 137/78 | HR 84 | Temp 98.6°F | Resp 20 | Ht 75.0 in | Wt 197.8 lb

## 2021-03-03 DIAGNOSIS — C61 Malignant neoplasm of prostate: Secondary | ICD-10-CM

## 2021-03-03 NOTE — Progress Notes (Signed)
  Radiation Oncology         [phone]) [phone] ________________________________  Name: [name] MRN: [id number]  Date: 03/03/2021  DOB: Jun 24, 1950  COMPLEX SIMULATION NOTE  NARRATIVE:  The patient was brought to the [facility] today following prostate seed implantation approximately one month ago.  Identity was confirmed.  All relevant records and images related to the planned course of therapy were reviewed.  Then, the patient was set-up supine.  CT images were obtained.  The CT images were loaded into the planning software.  Then the prostate and rectum were contoured.  Treatment planning then occurred.  The implanted iodine 125 seeds were identified by the physics staff for projection of radiation distribution  I have requested : 3D Simulation  I have requested a DVH of the following structures: Prostate and rectum.    ________________________________  Sheral Apley Tammi Klippel, M.D.

## 2021-03-03 NOTE — Progress Notes (Signed)
Patient reports pelvic discomfort 2/10, some dysuria, and mild stream weakness. No other symptoms reported at this time.  I-PSS Score of 7 (mild). Meaningful use complete.  Currently on Flomax 0.4mg , and urology follow-up pending x1 month at [facility] urology w/ Dr. Junious Silk -per patient.  BP 137/78 (BP Location: Right Arm, Patient Position: Sitting, Cuff Size: Normal)   Pulse 84   Temp 98.6 F (37 C) (Oral)   Resp 20   Ht 6\' 3"  (1.905 m)   Wt 197 lb 12.8 oz (89.7 kg)   SpO2 100%   BMI 24.72 kg/m

## 2021-03-03 NOTE — Progress Notes (Signed)
Radiation Oncology         (336) [phone] ________________________________  Name: [name] MRN: [id number]  Date: 03/03/2021  DOB: 13-Dec-1950  Post-Seed Follow-Up Visit Note  CC: Lawerance Cruel, MD  Festus Aloe, MD  Diagnosis:   70 y.o. gentleman with Stage T2a adenocarcinoma of the prostate with Gleason score of 3+4, and PSA of 2.35.    ICD-10-CM   1. Malignant neoplasm of prostate ([place])  C61       Interval Since Last Radiation:  3 weeks 02/10/21:  Insertion of radioactive I-125 seeds into the prostate gland; 145 Gy, definitive therapy with placement of SpaceOAR gel.  Narrative:  The patient returns today for routine follow-up.  He is complaining of increased urinary frequency and urinary hesitation symptoms. He filled out a questionnaire regarding urinary function today providing and overall IPSS score of 7 characterizing his symptoms as mild with nocturia x3.  He is taking Flomax qhs. His pre-implant score was 2. He denies any abdominal pain or bowel symptoms.His energy level has remained good and he is quite pleased with his progress to date.  ALLERGIES:  has No Known Allergies.  Meds: Current Outpatient Medications  Medication Sig Dispense Refill   Alpha-Lipoic Acid 100 MG CAPS Take by mouth.     Ascorbic Acid (VITAMIN C) 1000 MG tablet Take 1,000 mg by mouth daily.     calcium-vitamin D (OSCAL WITH D) 500-200 MG-UNIT tablet Take 1 tablet by mouth.     fexofenadine-pseudoephedrine (ALLEGRA-D) 60-120 MG per tablet Take 1 tablet by mouth daily.      glucosamine-chondroitin 500-400 MG tablet Take 1 tablet by mouth daily.     HYDROcodone-acetaminophen (NORCO) 7.5-325 MG tablet Take 1 tablet by mouth every 6 (six) hours as needed for moderate pain. 10 tablet 0   Magnesium 500 MG CAPS Take by mouth.     Misc Natural Products (PROSTATE HEALTH PO) Take by mouth.     omega-3 acid ethyl esters (LOVAZA) 1 G capsule Take 1 g by mouth daily.     Probiotic Product (PROBIOTIC  ADVANCED PO) Take by mouth. ALIGN     tamsulosin (FLOMAX) 0.4 MG CAPS capsule Take 1 capsule (0.4 mg total) by mouth daily after supper. 10 capsule 0   No current facility-administered medications for this visit.    Physical Findings: In general this is a well appearing Caucasian male in no acute distress. He's alert and oriented x4 and appropriate throughout the examination. Cardiopulmonary assessment is negative for acute distress and he exhibits normal effort.   Lab Findings: Lab Results  Component Value Date   WBC 6.2 02/07/2021   HGB 14.1 02/07/2021   HCT 41.6 02/07/2021   MCV 95.4 02/07/2021   PLT 302 02/07/2021    Radiographic Findings:  Patient underwent CT imaging in our clinic for post implant dosimetry. The CT will be reviewed by Dr. Tammi Klippel to confirm there is an adequate distribution of radioactive seeds throughout the prostate gland and ensure that there are no seeds in or near the rectum.  We suspect the final radiation plan and dosimetry will show appropriate coverage of the prostate gland. He understands that we will call and inform him of any unexpected findings on further review of his imaging and dosimetry.  Impression/Plan: 70 y.o. gentleman with Stage T2a adenocarcinoma of the prostate with Gleason score of 3+4, and PSA of 2.35. The patient is recovering from the effects of radiation. His urinary symptoms should gradually improve over the next 4-6  months. We talked about this today. He is encouraged by his improvement already and is otherwise pleased with his outcome. We also talked about long-term follow-up for prostate cancer following seed implant. He understands that ongoing PSA determinations and digital rectal exams will help perform surveillance to rule out disease recurrence. He does not currently have any follow up appointments scheduled with Dr. Junious Silk to his knowledge but understands that his initial post-treatment PSA will likely be checked in Jan. 2023,  approximately 3 months out from his procedure. He understands what to expect with his PSA measures. Patient was also educated today about some of the long-term effects from radiation including a small risk for rectal bleeding and possibly erectile dysfunction. We talked about some of the general management approaches to these potential complications. However, I did encourage the patient to contact our office or return at any point if he has questions or concerns related to his previous radiation and prostate cancer.    Nicholos Johns, PA-C

## 2021-03-06 ENCOUNTER — Encounter: Payer: Self-pay | Admitting: Dermatology

## 2021-03-06 ENCOUNTER — Other Ambulatory Visit: Payer: Self-pay

## 2021-03-06 ENCOUNTER — Ambulatory Visit: Payer: PPO | Admitting: Dermatology

## 2021-03-06 DIAGNOSIS — L570 Actinic keratosis: Secondary | ICD-10-CM

## 2021-03-06 DIAGNOSIS — Z85828 Personal history of other malignant neoplasm of skin: Secondary | ICD-10-CM

## 2021-03-06 DIAGNOSIS — Z1283 Encounter for screening for malignant neoplasm of skin: Secondary | ICD-10-CM

## 2021-03-06 DIAGNOSIS — D485 Neoplasm of uncertain behavior of skin: Secondary | ICD-10-CM

## 2021-03-06 NOTE — Patient Instructions (Signed)

## 2021-03-09 ENCOUNTER — Telehealth: Payer: Self-pay

## 2021-03-09 NOTE — Telephone Encounter (Signed)
-----   Message from Lavonna Monarch, MD sent at 03/09/2021  6:04 AM EDT ----- Please let patient know that biopsy showed a thick precancer and if there is any residual after the site heals we can freeze the spot

## 2021-03-09 NOTE — Telephone Encounter (Signed)
Phone call to patient with his pathology results. Patient aware.

## 2021-03-14 DIAGNOSIS — C61 Malignant neoplasm of prostate: Secondary | ICD-10-CM | POA: Diagnosis not present

## 2021-03-17 ENCOUNTER — Encounter: Payer: Self-pay | Admitting: Radiation Oncology

## 2021-03-17 ENCOUNTER — Ambulatory Visit
Admission: RE | Admit: 2021-03-17 | Discharge: 2021-03-17 | Disposition: A | Discharge status: Home / Self Care | Payer: PPO | Source: Ambulatory Visit | Attending: Radiation Oncology | Admitting: Radiation Oncology

## 2021-03-17 DIAGNOSIS — C61 Malignant neoplasm of prostate: Secondary | ICD-10-CM | POA: Insufficient documentation

## 2021-03-19 ENCOUNTER — Encounter: Payer: Self-pay | Admitting: Dermatology

## 2021-03-19 NOTE — Progress Notes (Signed)
   Follow-Up Visit   Subjective  [name] is a 70 y.o. male who presents for the following: Annual Exam (No new concerns).  General skin examination Location:  Duration:  Quality:  Associated Signs/Symptoms: Modifying Factors:  Severity:  Timing: Context:   Objective  Well appearing patient in no apparent distress; mood and affect are within normal limits. Torso - Posterior (Back) Full body skin examination no atypical pigmented lesions, no recurrent nonmelanoma skin cancer.  1 possible new carcinoma right cheek will be biopsied and treated  Right Posterior Mandible Moderately thick 7 mm pink crust, possible superficial carcinoma  Right Lower Leg - Anterior No sign recurrence    A full examination was performed including scalp, head, eyes, ears, nose, lips, neck, chest, axillae, abdomen, back, buttocks, bilateral upper extremities, bilateral lower extremities, hands, feet, fingers, toes, fingernails, and toenails. All findings within normal limits unless otherwise noted below.   Assessment & Plan    Encounter for screening for malignant neoplasm of skin Torso - Posterior (Back)  Annual skin examination  Neoplasm of uncertain behavior of skin Right Posterior Mandible  Skin / nail biopsy Type of biopsy: tangential   Informed consent: discussed and consent obtained   Timeout: patient name, date of birth, surgical site, and procedure verified   Procedure prep:  Patient was prepped and draped in usual sterile fashion (Non sterile) Prep type:  Chlorhexidine Anesthesia: the lesion was anesthetized in a standard fashion   Anesthetic:  1% lidocaine w/ epinephrine 1-100,000 local infiltration Instrument used: flexible razor blade   Outcome: patient tolerated procedure well   Post-procedure details: wound care instructions given    Destruction of lesion Complexity: simple   Destruction method: electrodesiccation and curettage   Informed consent: discussed and  consent obtained   Timeout:  patient name, date of birth, surgical site, and procedure verified Anesthesia: the lesion was anesthetized in a standard fashion   Anesthetic:  1% lidocaine w/ epinephrine 1-100,000 local infiltration Curettage performed in three different directions: Yes   Electrodesiccation performed over the curetted area: Yes   Curettage cycles:  1 Margin per side (cm):  0.1 Final wound size (cm):  1 Hemostasis achieved with:  aluminum chloride Outcome: patient tolerated procedure well with no complications   Post-procedure details: wound care instructions given    Specimen 1 - Surgical pathology Differential Diagnosis: bcc vs [id number]  Check Margins: No  After shave biopsy the base of the lesion was curetted and cauterized  Personal history of skin cancer Right Lower Leg - Anterior  Check as needed change      I, Lavonna Monarch, MD, have reviewed all documentation for this visit.  The documentation on 03/19/21 for the exam, diagnosis, procedures, and orders are all accurate and complete.

## 2021-04-09 NOTE — Progress Notes (Signed)
  Radiation Oncology         [phone] ________________________________  Name: JONCARLOS ATKISON MRN: [id number]  Date: 03/17/2021  DOB: June 27, 1950  3D Planning Note   Prostate Brachytherapy Post-Implant Dosimetry  Diagnosis: 70 y.o. gentleman with Stage T2a adenocarcinoma of the prostate with Gleason score of 3+4, and PSA of 2.35.  Narrative: On a previous date, DORON SHAKE returned following prostate seed implantation for post implant planning. He underwent CT scan complex simulation to delineate the three-dimensional structures of the pelvis and demonstrate the radiation distribution.  Since that time, the seed localization, and complex isodose planning with dose volume histograms have now been completed.  Results:   Prostate Coverage - The dose of radiation delivered to the 90% or more of the prostate gland (D90) was 100.41% of the prescription dose. This exceeds our goal of greater than 90%. Rectal Sparing - The volume of rectal tissue receiving the prescription dose or higher was 0.0 cc. This falls under our thresholds tolerance of 1.0 cc.  Impression: The prostate seed implant appears to show adequate target coverage and appropriate rectal sparing.  Plan:  The patient will continue to follow with urology for ongoing PSA determinations. I would anticipate a high likelihood for local tumor control with minimal risk for rectal morbidity.  ________________________________  Sheral Apley Tammi Klippel, M.D.

## 2021-04-18 ENCOUNTER — Other Ambulatory Visit: Payer: Self-pay

## 2021-04-18 ENCOUNTER — Inpatient Hospital Stay: Payer: PPO | Attending: Adult Health | Admitting: *Deleted

## 2021-04-18 ENCOUNTER — Encounter: Payer: Self-pay | Admitting: *Deleted

## 2021-04-18 DIAGNOSIS — C61 Malignant neoplasm of prostate: Secondary | ICD-10-CM

## 2021-04-18 NOTE — Progress Notes (Signed)
  2 Identifiers were used for this visit for verification purposes only.SCP reviewed and completed. SDOH assessed and completed. Pt is doing very well at this time. Pt states he has almost no urinary urgency and only gets up to restroom 2x at night. He feels he is getting proper amount of sleep. Pain with urination has also subsided. Pt feels like he is emptying his bladder completely.Urine stream is sometimes slower at night, but seems to be more normal during the day. Bowels are moving fine. He does take a daily probiotic. The only medication change is he now takes a vitamin B complex, Vit D 4000 units daily and aspirin 81 mg. Pt continues to exercise everyday. Vaccines are up to date. Next colonoscopy scheduled in 2023. He sees his PCP and dermatologist annually. Pt denies fatigue and pain.

## 2021-05-18 ENCOUNTER — Telehealth: Payer: Self-pay | Admitting: Dermatology

## 2021-05-18 NOTE — Telephone Encounter (Signed)
Patient is calling to say that he was told at his last visit to call the office after the first of the year to get a prescription sent in for treating precancers.  Patient uses  [place]

## 2021-05-22 NOTE — Telephone Encounter (Signed)
Patient calling for update on his request. Informed patient today with providers first day back in clinic since his call and we will send request to provider and inform him of the outcome. Patient voiced understanding.

## 2021-05-23 MED ORDER — TOLAK 4 % EX CREA: TOPICAL_CREAM | CUTANEOUS | 0 refills | Status: AC

## 2021-05-23 NOTE — Addendum Note (Signed)
Addended by: Lennie Odor on: 05/23/2021 11:02 AM   Modules accepted: Orders

## 2021-05-23 NOTE — Telephone Encounter (Signed)
Phone call to patient with Dr. Onalee Hua recommendations. Patient aware, [facility] number given to patient.

## 2021-06-05 DIAGNOSIS — C61 Malignant neoplasm of prostate: Secondary | ICD-10-CM | POA: Diagnosis not present

## 2021-06-12 DIAGNOSIS — R3912 Poor urinary stream: Secondary | ICD-10-CM | POA: Diagnosis not present

## 2021-06-12 DIAGNOSIS — N401 Enlarged prostate with lower urinary tract symptoms: Secondary | ICD-10-CM | POA: Diagnosis not present

## 2021-06-12 DIAGNOSIS — C61 Malignant neoplasm of prostate: Secondary | ICD-10-CM | POA: Diagnosis not present

## 2021-09-04 DIAGNOSIS — R7309 Other abnormal glucose: Secondary | ICD-10-CM | POA: Diagnosis not present

## 2021-09-04 DIAGNOSIS — E7800 Pure hypercholesterolemia, unspecified: Secondary | ICD-10-CM | POA: Diagnosis not present

## 2021-09-04 DIAGNOSIS — Z79899 Other long term (current) drug therapy: Secondary | ICD-10-CM | POA: Diagnosis not present

## 2021-09-07 ENCOUNTER — Other Ambulatory Visit: Payer: Self-pay | Admitting: Family Medicine

## 2021-09-07 DIAGNOSIS — Z0000 Encounter for general adult medical examination without abnormal findings: Secondary | ICD-10-CM | POA: Diagnosis not present

## 2021-09-07 DIAGNOSIS — R7309 Other abnormal glucose: Secondary | ICD-10-CM | POA: Diagnosis not present

## 2021-09-07 DIAGNOSIS — E7800 Pure hypercholesterolemia, unspecified: Secondary | ICD-10-CM | POA: Diagnosis not present

## 2021-09-28 DIAGNOSIS — C61 Malignant neoplasm of prostate: Secondary | ICD-10-CM | POA: Diagnosis not present

## 2021-10-03 DIAGNOSIS — D123 Benign neoplasm of transverse colon: Secondary | ICD-10-CM | POA: Diagnosis not present

## 2021-10-03 DIAGNOSIS — D120 Benign neoplasm of cecum: Secondary | ICD-10-CM | POA: Diagnosis not present

## 2021-10-03 DIAGNOSIS — Z09 Encounter for follow-up examination after completed treatment for conditions other than malignant neoplasm: Secondary | ICD-10-CM | POA: Diagnosis not present

## 2021-10-03 DIAGNOSIS — Z86010 Personal history of colonic polyps: Secondary | ICD-10-CM | POA: Diagnosis not present

## 2021-10-03 DIAGNOSIS — K5730 Diverticulosis of large intestine without perforation or abscess without bleeding: Secondary | ICD-10-CM | POA: Diagnosis not present

## 2021-10-05 DIAGNOSIS — C61 Malignant neoplasm of prostate: Secondary | ICD-10-CM | POA: Diagnosis not present

## 2021-10-05 DIAGNOSIS — N401 Enlarged prostate with lower urinary tract symptoms: Secondary | ICD-10-CM | POA: Diagnosis not present

## 2021-10-05 DIAGNOSIS — R3912 Poor urinary stream: Secondary | ICD-10-CM | POA: Diagnosis not present

## 2021-10-05 DIAGNOSIS — D120 Benign neoplasm of cecum: Secondary | ICD-10-CM | POA: Diagnosis not present

## 2021-10-05 DIAGNOSIS — D123 Benign neoplasm of transverse colon: Secondary | ICD-10-CM | POA: Diagnosis not present

## 2021-10-23 ENCOUNTER — Ambulatory Visit
Admission: RE | Admit: 2021-10-23 | Discharge: 2021-10-23 | Disposition: A | Discharge status: Home / Self Care | Payer: No Typology Code available for payment source | Source: Ambulatory Visit | Attending: Family Medicine | Admitting: Family Medicine

## 2021-10-23 DIAGNOSIS — Z0000 Encounter for general adult medical examination without abnormal findings: Secondary | ICD-10-CM

## 2021-10-23 DIAGNOSIS — I700 Atherosclerosis of aorta: Secondary | ICD-10-CM | POA: Diagnosis not present

## 2021-12-11 ENCOUNTER — Encounter: Payer: Self-pay | Admitting: Dermatology

## 2022-01-08 DIAGNOSIS — H524 Presbyopia: Secondary | ICD-10-CM | POA: Diagnosis not present

## 2022-01-08 DIAGNOSIS — H1045 Other chronic allergic conjunctivitis: Secondary | ICD-10-CM | POA: Diagnosis not present

## 2022-01-08 DIAGNOSIS — H0102A Squamous blepharitis right eye, upper and lower eyelids: Secondary | ICD-10-CM | POA: Diagnosis not present

## 2022-01-08 DIAGNOSIS — H5213 Myopia, bilateral: Secondary | ICD-10-CM | POA: Diagnosis not present

## 2022-01-08 DIAGNOSIS — H0102B Squamous blepharitis left eye, upper and lower eyelids: Secondary | ICD-10-CM | POA: Diagnosis not present

## 2022-01-08 DIAGNOSIS — H43813 Vitreous degeneration, bilateral: Secondary | ICD-10-CM | POA: Diagnosis not present

## 2022-01-08 DIAGNOSIS — H52223 Regular astigmatism, bilateral: Secondary | ICD-10-CM | POA: Diagnosis not present

## 2022-01-08 DIAGNOSIS — H04123 Dry eye syndrome of bilateral lacrimal glands: Secondary | ICD-10-CM | POA: Diagnosis not present

## 2022-01-08 DIAGNOSIS — H2513 Age-related nuclear cataract, bilateral: Secondary | ICD-10-CM | POA: Diagnosis not present

## 2022-02-12 DIAGNOSIS — Z8349 Family history of other endocrine, nutritional and metabolic diseases: Secondary | ICD-10-CM | POA: Diagnosis not present

## 2022-02-12 DIAGNOSIS — Z125 Encounter for screening for malignant neoplasm of prostate: Secondary | ICD-10-CM | POA: Diagnosis not present

## 2022-02-12 DIAGNOSIS — R7309 Other abnormal glucose: Secondary | ICD-10-CM | POA: Diagnosis not present

## 2022-02-13 DIAGNOSIS — H903 Sensorineural hearing loss, bilateral: Secondary | ICD-10-CM | POA: Diagnosis not present

## 2022-02-13 DIAGNOSIS — H9042 Sensorineural hearing loss, unilateral, left ear, with unrestricted hearing on the contralateral side: Secondary | ICD-10-CM | POA: Diagnosis not present

## 2022-04-12 DIAGNOSIS — C61 Malignant neoplasm of prostate: Secondary | ICD-10-CM | POA: Diagnosis not present

## 2022-04-19 DIAGNOSIS — N401 Enlarged prostate with lower urinary tract symptoms: Secondary | ICD-10-CM | POA: Diagnosis not present

## 2022-04-19 DIAGNOSIS — M170 Bilateral primary osteoarthritis of knee: Secondary | ICD-10-CM | POA: Diagnosis not present

## 2022-04-19 DIAGNOSIS — R351 Nocturia: Secondary | ICD-10-CM | POA: Diagnosis not present

## 2022-04-19 DIAGNOSIS — Z8546 Personal history of malignant neoplasm of prostate: Secondary | ICD-10-CM | POA: Diagnosis not present

## 2022-04-26 DIAGNOSIS — M1712 Unilateral primary osteoarthritis, left knee: Secondary | ICD-10-CM | POA: Diagnosis not present

## 2022-04-26 DIAGNOSIS — M170 Bilateral primary osteoarthritis of knee: Secondary | ICD-10-CM | POA: Diagnosis not present

## 2022-04-26 DIAGNOSIS — M1711 Unilateral primary osteoarthritis, right knee: Secondary | ICD-10-CM | POA: Diagnosis not present

## 2022-05-03 DIAGNOSIS — M170 Bilateral primary osteoarthritis of knee: Secondary | ICD-10-CM | POA: Diagnosis not present

## 2022-07-16 DIAGNOSIS — M2041 Other hammer toe(s) (acquired), right foot: Secondary | ICD-10-CM | POA: Diagnosis not present

## 2022-07-16 DIAGNOSIS — L84 Corns and callosities: Secondary | ICD-10-CM | POA: Diagnosis not present

## 2022-07-24 DIAGNOSIS — D225 Melanocytic nevi of trunk: Secondary | ICD-10-CM | POA: Diagnosis not present

## 2022-07-24 DIAGNOSIS — Z1283 Encounter for screening for malignant neoplasm of skin: Secondary | ICD-10-CM | POA: Diagnosis not present

## 2022-07-24 DIAGNOSIS — D485 Neoplasm of uncertain behavior of skin: Secondary | ICD-10-CM | POA: Diagnosis not present

## 2022-07-24 DIAGNOSIS — L570 Actinic keratosis: Secondary | ICD-10-CM | POA: Diagnosis not present

## 2022-07-24 DIAGNOSIS — L821 Other seborrheic keratosis: Secondary | ICD-10-CM | POA: Diagnosis not present

## 2022-07-24 DIAGNOSIS — L820 Inflamed seborrheic keratosis: Secondary | ICD-10-CM | POA: Diagnosis not present

## 2022-07-24 DIAGNOSIS — X32XXXA Exposure to sunlight, initial encounter: Secondary | ICD-10-CM | POA: Diagnosis not present

## 2022-08-15 DIAGNOSIS — D485 Neoplasm of uncertain behavior of skin: Secondary | ICD-10-CM | POA: Diagnosis not present

## 2022-08-15 DIAGNOSIS — D225 Melanocytic nevi of trunk: Secondary | ICD-10-CM | POA: Diagnosis not present

## 2022-08-15 DIAGNOSIS — M13831 Other specified arthritis, right wrist: Secondary | ICD-10-CM | POA: Diagnosis not present

## 2022-08-15 DIAGNOSIS — M79641 Pain in right hand: Secondary | ICD-10-CM | POA: Diagnosis not present

## 2022-08-15 DIAGNOSIS — X32XXXD Exposure to sunlight, subsequent encounter: Secondary | ICD-10-CM | POA: Diagnosis not present

## 2022-08-15 DIAGNOSIS — L570 Actinic keratosis: Secondary | ICD-10-CM | POA: Diagnosis not present

## 2022-08-15 DIAGNOSIS — M13841 Other specified arthritis, right hand: Secondary | ICD-10-CM | POA: Diagnosis not present

## 2022-08-15 DIAGNOSIS — L988 Other specified disorders of the skin and subcutaneous tissue: Secondary | ICD-10-CM | POA: Diagnosis not present

## 2022-08-15 DIAGNOSIS — M79642 Pain in left hand: Secondary | ICD-10-CM | POA: Diagnosis not present

## 2022-09-10 DIAGNOSIS — C61 Malignant neoplasm of prostate: Secondary | ICD-10-CM | POA: Diagnosis not present

## 2022-09-10 DIAGNOSIS — R7309 Other abnormal glucose: Secondary | ICD-10-CM | POA: Diagnosis not present

## 2022-09-11 DIAGNOSIS — L905 Scar conditions and fibrosis of skin: Secondary | ICD-10-CM | POA: Diagnosis not present

## 2022-09-11 DIAGNOSIS — D225 Melanocytic nevi of trunk: Secondary | ICD-10-CM | POA: Diagnosis not present

## 2022-09-11 DIAGNOSIS — D485 Neoplasm of uncertain behavior of skin: Secondary | ICD-10-CM | POA: Diagnosis not present

## 2022-09-18 DIAGNOSIS — I700 Atherosclerosis of aorta: Secondary | ICD-10-CM | POA: Diagnosis not present

## 2022-09-18 DIAGNOSIS — R7303 Prediabetes: Secondary | ICD-10-CM | POA: Diagnosis not present

## 2022-09-18 DIAGNOSIS — Z8546 Personal history of malignant neoplasm of prostate: Secondary | ICD-10-CM | POA: Diagnosis not present

## 2022-09-18 DIAGNOSIS — I77810 Thoracic aortic ectasia: Secondary | ICD-10-CM | POA: Diagnosis not present

## 2022-09-18 DIAGNOSIS — E7800 Pure hypercholesterolemia, unspecified: Secondary | ICD-10-CM | POA: Diagnosis not present

## 2022-09-18 DIAGNOSIS — Z0000 Encounter for general adult medical examination without abnormal findings: Secondary | ICD-10-CM | POA: Diagnosis not present

## 2022-09-18 DIAGNOSIS — Z79899 Other long term (current) drug therapy: Secondary | ICD-10-CM | POA: Diagnosis not present

## 2022-09-19 ENCOUNTER — Other Ambulatory Visit: Payer: Self-pay | Admitting: Family Medicine

## 2022-09-19 DIAGNOSIS — I77810 Thoracic aortic ectasia: Secondary | ICD-10-CM

## 2022-09-26 ENCOUNTER — Encounter: Payer: Self-pay | Admitting: Family Medicine

## 2022-10-08 IMAGING — CT CT CARDIAC CORONARY ARTERY CALCIUM SCORE
2 of 3 series · 10 of 20 positions shown, 12 images · non-contrast
Comparison: None Available.

CLINICAL DATA: 71-year-old white male

EXAM:
CT CARDIAC CORONARY ARTERY CALCIUM SCORE
TECHNIQUE: Non-contrast imaging through the heart was performed using
prospective ECG gating. Image post processing was performed on an
independent workstation, allowing for quantitative analysis of the
heart and coronary arteries. Note that this exam targets the heart
and the chest was not imaged in its entirety.

[Series 3: calcium scoring 2.00 br40 bestdiast 71% axial · axial · 0.57mm/px · z∈[+1608,+1712]mm · 5 of 80 slices shown, 7 images]
[im 14/80  vessel]
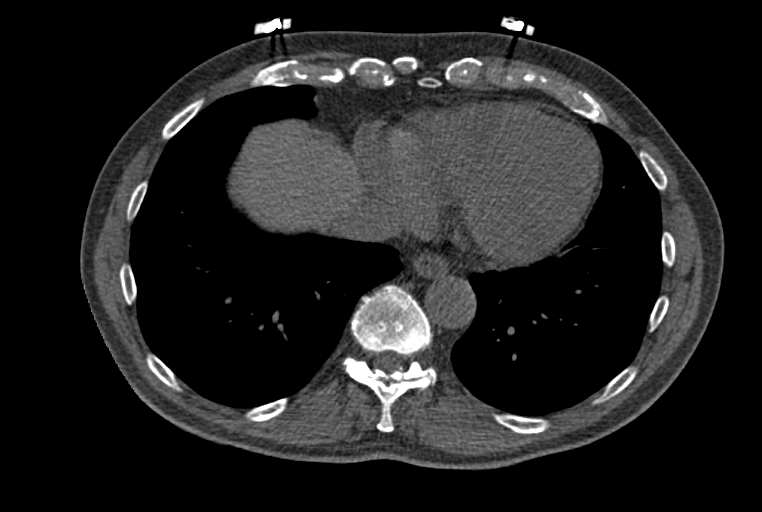
[im 14/80  lung]
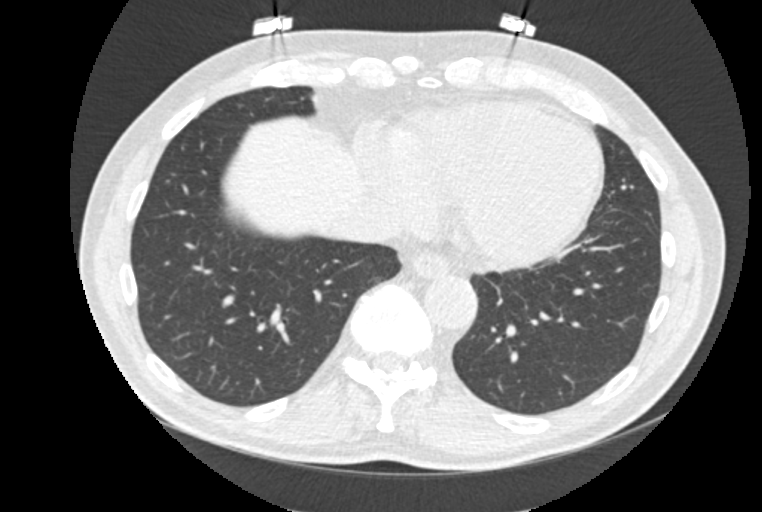
[im 27/80  vessel]
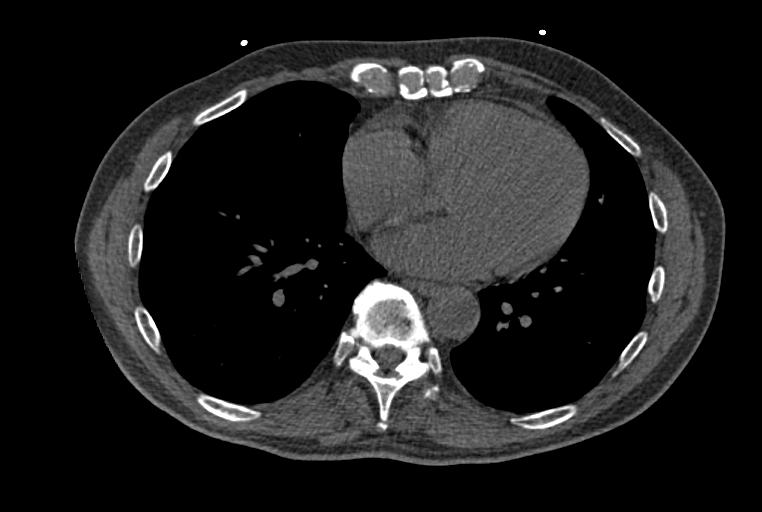
[im 40/80  vessel]
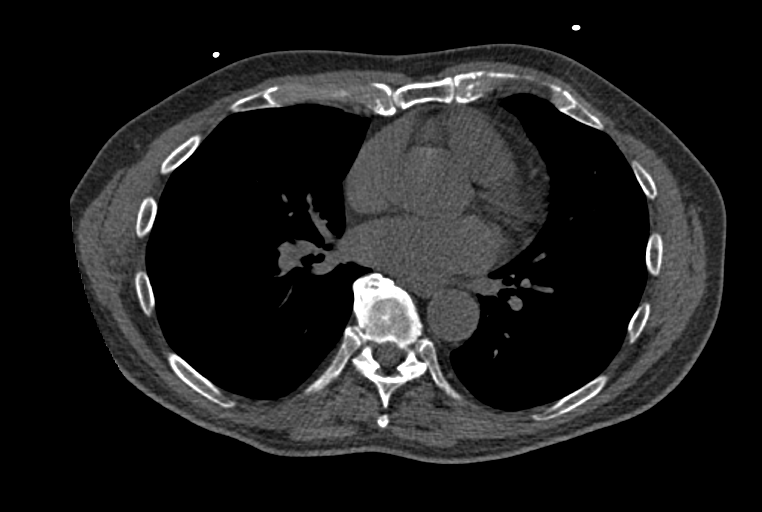
[im 53/80  vessel]
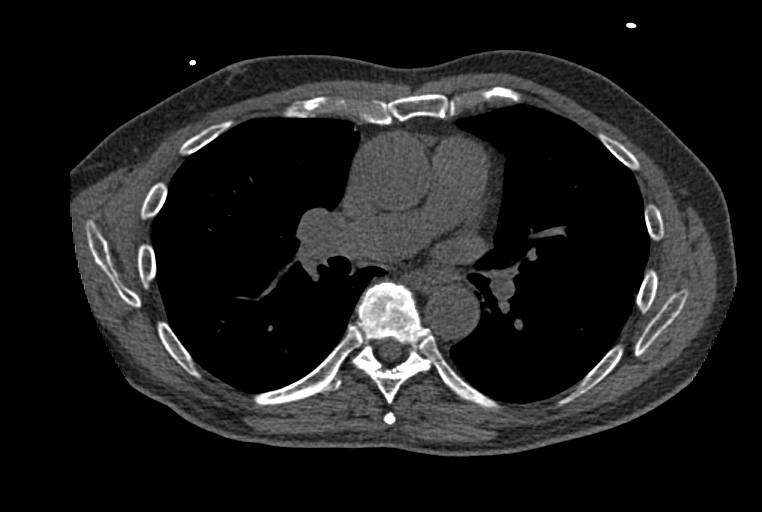
[im 66/80  vessel]
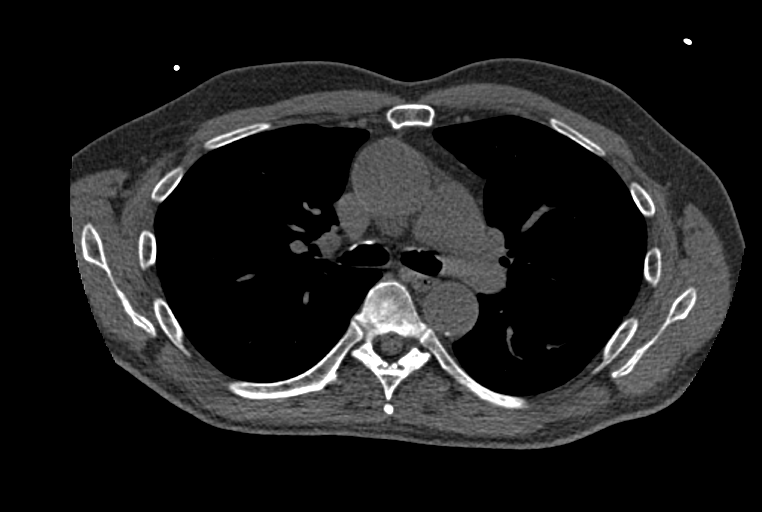
[im 66/80  lung]
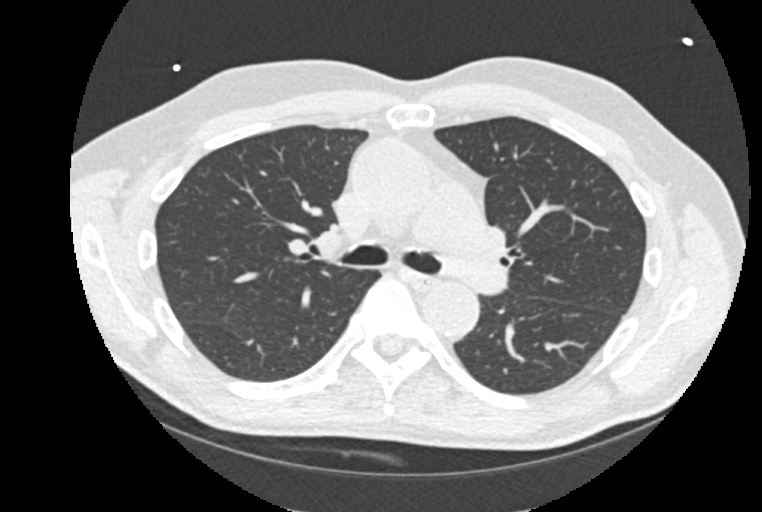

[Series 9: calcium scoring 2.00 br60 bestdiast 71% lungs · axial · 0.57mm/px · z∈[+1608,+1712]mm · 5 of 80 slices shown]
[im 14/80  vessel]
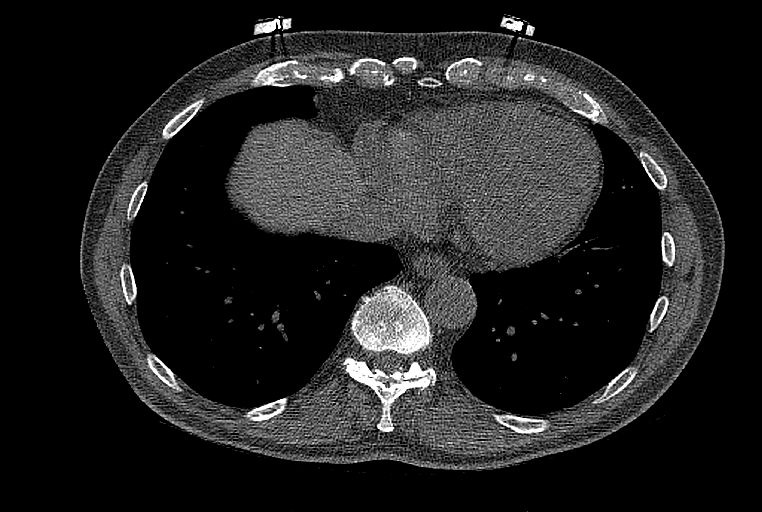
[im 27/80  vessel]
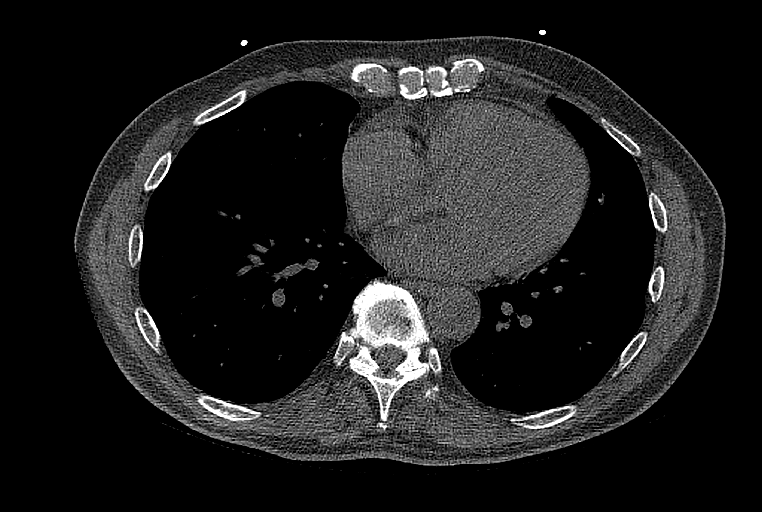
[im 40/80  vessel]
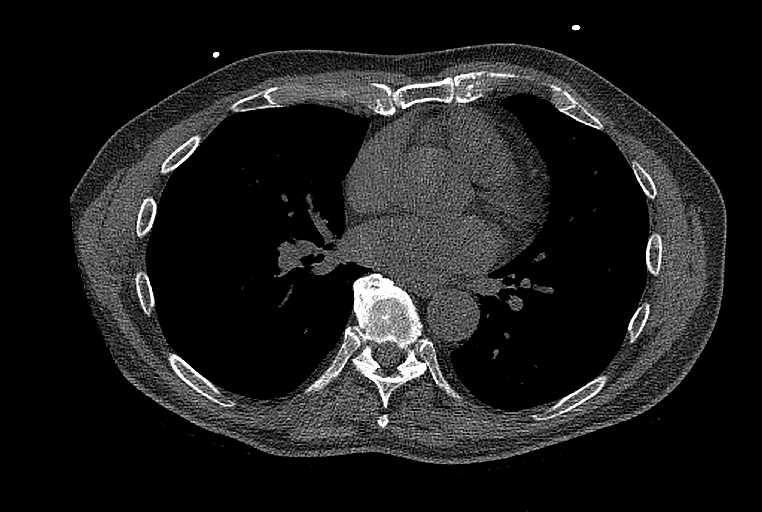
[im 53/80  vessel]
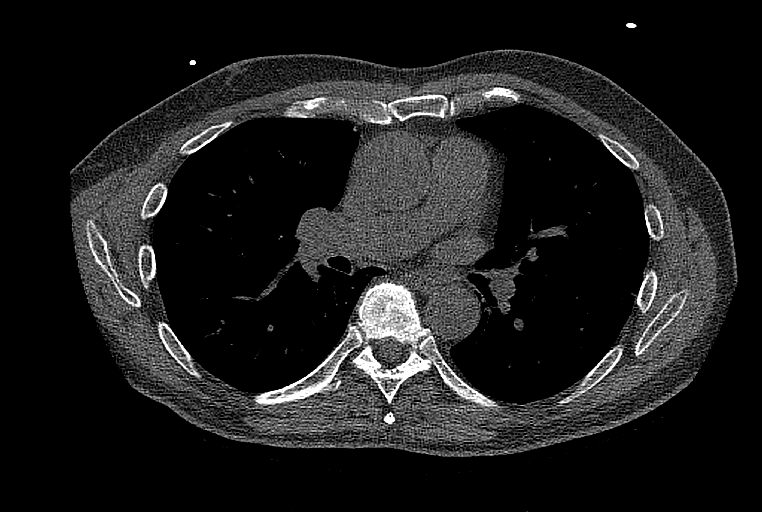
[im 66/80  vessel]
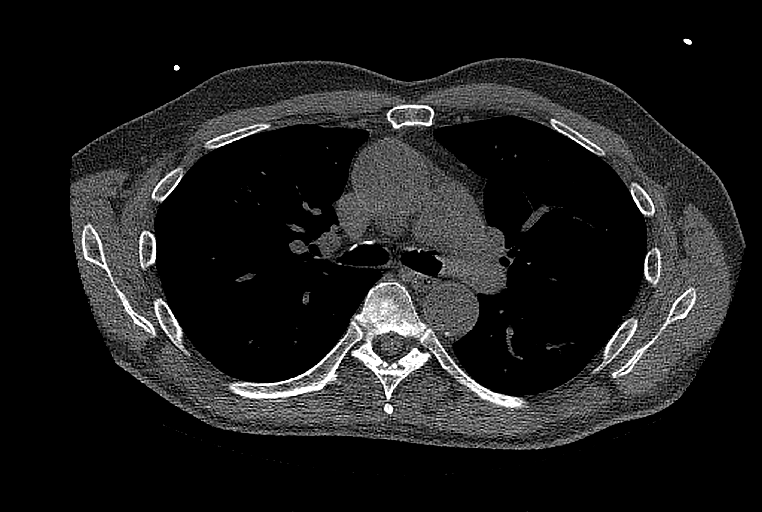

[10 of 20 positions shown; findings below may reference images not displayed]

FINDINGS: CORONARY CALCIUM SCORES:

Left Main: 0

LAD:

LCx: 0

RCA: 0

Total Agatston Score:

[HOSPITAL] percentile: 22

AORTA MEASUREMENTS:

Ascending Aorta: 43 mm

Descending Aorta: 30 mm

OTHER FINDINGS:

Vascular: Normal heart size. No pericardial effusion. Mildly dilated
ascending thoracic aorta with mild atherosclerotic disease.

Mediastinum/Nodes: Esophagus is unremarkable. No pathologically
enlarged lymph nodes seen in the chest.

Lungs/Pleura: Central airways are patent. No consolidation, pleural
effusion or pneumothorax.

Upper Abdomen: No acute abnormality.

Musculoskeletal: No chest wall mass or suspicious bone lesions
identified.
IMPRESSION: 1. Total calcium score of 248 is at percentile 22 for subjects of
the same age, gender, and race/ethnicity.
2. Mild dilation of the ascending thoracic aorta, measuring up to
4.3 cm. Recommend annual imaging followup by CTA or MRA. This
recommendation follows 3515
ACCF/AHA/AATS/ACR/ASA/SCA/JOHN CYRIL/WENDARUWA/SORIN/MENDOSA Guidelines for the
Diagnosis and Management of Patients with Thoracic Aortic Disease.
Circulation. 3515; 121: E266-e369. Aortic aneurysm NOS (O6ARL-5E9.6)
3.  Aortic Atherosclerosis (O6ARL-Q1O.O).

## 2022-11-05 ENCOUNTER — Ambulatory Visit
Admission: RE | Admit: 2022-11-05 | Discharge: 2022-11-05 | Disposition: A | Discharge status: Home / Self Care | Payer: PPO | Source: Ambulatory Visit | Attending: Family Medicine | Admitting: Family Medicine

## 2022-11-05 DIAGNOSIS — I7121 Aneurysm of the ascending aorta, without rupture: Secondary | ICD-10-CM | POA: Diagnosis not present

## 2022-11-05 DIAGNOSIS — I77810 Thoracic aortic ectasia: Secondary | ICD-10-CM

## 2022-11-05 MED ORDER — GADOPICLENOL 0.5 MMOL/ML IV SOLN
9.0000 mL | Freq: Once | INTRAVENOUS | Status: AC | PRN
  Administered 2022-11-05: 9 mL via INTRAVENOUS

## 2022-11-07 DIAGNOSIS — H903 Sensorineural hearing loss, bilateral: Secondary | ICD-10-CM | POA: Diagnosis not present

## 2022-11-12 ENCOUNTER — Other Ambulatory Visit: Payer: PPO

## 2023-01-28 DIAGNOSIS — E7800 Pure hypercholesterolemia, unspecified: Secondary | ICD-10-CM | POA: Diagnosis not present

## 2023-01-29 DIAGNOSIS — D2272 Melanocytic nevi of left lower limb, including hip: Secondary | ICD-10-CM | POA: Diagnosis not present

## 2023-01-29 DIAGNOSIS — D225 Melanocytic nevi of trunk: Secondary | ICD-10-CM | POA: Diagnosis not present

## 2023-01-29 DIAGNOSIS — Z1283 Encounter for screening for malignant neoplasm of skin: Secondary | ICD-10-CM | POA: Diagnosis not present

## 2023-01-29 DIAGNOSIS — D485 Neoplasm of uncertain behavior of skin: Secondary | ICD-10-CM | POA: Diagnosis not present

## 2023-02-19 DIAGNOSIS — D485 Neoplasm of uncertain behavior of skin: Secondary | ICD-10-CM | POA: Diagnosis not present

## 2023-02-19 DIAGNOSIS — L820 Inflamed seborrheic keratosis: Secondary | ICD-10-CM | POA: Diagnosis not present

## 2023-02-19 DIAGNOSIS — L988 Other specified disorders of the skin and subcutaneous tissue: Secondary | ICD-10-CM | POA: Diagnosis not present

## 2023-07-18 DIAGNOSIS — D225 Melanocytic nevi of trunk: Secondary | ICD-10-CM | POA: Diagnosis not present

## 2023-07-18 DIAGNOSIS — D485 Neoplasm of uncertain behavior of skin: Secondary | ICD-10-CM | POA: Diagnosis not present

## 2023-07-18 DIAGNOSIS — Z1283 Encounter for screening for malignant neoplasm of skin: Secondary | ICD-10-CM | POA: Diagnosis not present

## 2023-07-30 DIAGNOSIS — H0102A Squamous blepharitis right eye, upper and lower eyelids: Secondary | ICD-10-CM | POA: Diagnosis not present

## 2023-07-30 DIAGNOSIS — H00022 Hordeolum internum right lower eyelid: Secondary | ICD-10-CM | POA: Diagnosis not present

## 2023-07-30 DIAGNOSIS — H00024 Hordeolum internum left upper eyelid: Secondary | ICD-10-CM | POA: Diagnosis not present

## 2023-07-30 DIAGNOSIS — H0102B Squamous blepharitis left eye, upper and lower eyelids: Secondary | ICD-10-CM | POA: Diagnosis not present

## 2023-08-15 DIAGNOSIS — M170 Bilateral primary osteoarthritis of knee: Secondary | ICD-10-CM | POA: Diagnosis not present

## 2023-08-15 DIAGNOSIS — M1711 Unilateral primary osteoarthritis, right knee: Secondary | ICD-10-CM | POA: Diagnosis not present

## 2023-08-15 DIAGNOSIS — M1712 Unilateral primary osteoarthritis, left knee: Secondary | ICD-10-CM | POA: Diagnosis not present

## 2023-08-28 DIAGNOSIS — H0012 Chalazion right lower eyelid: Secondary | ICD-10-CM | POA: Diagnosis not present

## 2023-08-28 DIAGNOSIS — H0102B Squamous blepharitis left eye, upper and lower eyelids: Secondary | ICD-10-CM | POA: Diagnosis not present

## 2023-08-28 DIAGNOSIS — H00024 Hordeolum internum left upper eyelid: Secondary | ICD-10-CM | POA: Diagnosis not present

## 2023-08-28 DIAGNOSIS — H0102A Squamous blepharitis right eye, upper and lower eyelids: Secondary | ICD-10-CM | POA: Diagnosis not present

## 2023-09-17 DIAGNOSIS — M170 Bilateral primary osteoarthritis of knee: Secondary | ICD-10-CM | POA: Diagnosis not present

## 2023-09-19 DIAGNOSIS — H00024 Hordeolum internum left upper eyelid: Secondary | ICD-10-CM | POA: Diagnosis not present

## 2023-09-19 DIAGNOSIS — H0102B Squamous blepharitis left eye, upper and lower eyelids: Secondary | ICD-10-CM | POA: Diagnosis not present

## 2023-09-19 DIAGNOSIS — H0102A Squamous blepharitis right eye, upper and lower eyelids: Secondary | ICD-10-CM | POA: Diagnosis not present

## 2023-09-19 DIAGNOSIS — H0012 Chalazion right lower eyelid: Secondary | ICD-10-CM | POA: Diagnosis not present

## 2023-09-23 DIAGNOSIS — E7800 Pure hypercholesterolemia, unspecified: Secondary | ICD-10-CM | POA: Diagnosis not present

## 2023-09-23 DIAGNOSIS — R7303 Prediabetes: Secondary | ICD-10-CM | POA: Diagnosis not present

## 2023-09-23 DIAGNOSIS — C61 Malignant neoplasm of prostate: Secondary | ICD-10-CM | POA: Diagnosis not present

## 2023-09-23 DIAGNOSIS — Z79899 Other long term (current) drug therapy: Secondary | ICD-10-CM | POA: Diagnosis not present

## 2023-09-24 DIAGNOSIS — M170 Bilateral primary osteoarthritis of knee: Secondary | ICD-10-CM | POA: Diagnosis not present

## 2023-10-01 DIAGNOSIS — M170 Bilateral primary osteoarthritis of knee: Secondary | ICD-10-CM | POA: Diagnosis not present

## 2023-10-02 DIAGNOSIS — E7800 Pure hypercholesterolemia, unspecified: Secondary | ICD-10-CM | POA: Diagnosis not present

## 2023-10-02 DIAGNOSIS — H0014 Chalazion left upper eyelid: Secondary | ICD-10-CM | POA: Diagnosis not present

## 2023-10-02 DIAGNOSIS — R7303 Prediabetes: Secondary | ICD-10-CM | POA: Diagnosis not present

## 2023-10-02 DIAGNOSIS — Z6825 Body mass index (BMI) 25.0-25.9, adult: Secondary | ICD-10-CM | POA: Diagnosis not present

## 2023-10-02 DIAGNOSIS — Z0000 Encounter for general adult medical examination without abnormal findings: Secondary | ICD-10-CM | POA: Diagnosis not present

## 2023-10-02 DIAGNOSIS — I77810 Thoracic aortic ectasia: Secondary | ICD-10-CM | POA: Diagnosis not present

## 2023-10-02 DIAGNOSIS — Z7189 Other specified counseling: Secondary | ICD-10-CM | POA: Diagnosis not present

## 2023-10-02 DIAGNOSIS — J4520 Mild intermittent asthma, uncomplicated: Secondary | ICD-10-CM | POA: Diagnosis not present

## 2023-10-02 DIAGNOSIS — H00024 Hordeolum internum left upper eyelid: Secondary | ICD-10-CM | POA: Diagnosis not present

## 2023-10-09 ENCOUNTER — Encounter: Payer: Self-pay | Admitting: Family Medicine

## 2023-10-10 ENCOUNTER — Other Ambulatory Visit: Payer: Self-pay | Admitting: Family Medicine

## 2023-10-10 DIAGNOSIS — I77810 Thoracic aortic ectasia: Secondary | ICD-10-CM

## 2023-11-10 ENCOUNTER — Ambulatory Visit
Admission: RE | Admit: 2023-11-10 | Discharge: 2023-11-10 | Disposition: A | Discharge status: Home / Self Care | Source: Ambulatory Visit | Attending: Family Medicine | Admitting: Family Medicine

## 2023-11-10 DIAGNOSIS — I77810 Thoracic aortic ectasia: Secondary | ICD-10-CM

## 2023-11-10 DIAGNOSIS — I7121 Aneurysm of the ascending aorta, without rupture: Secondary | ICD-10-CM | POA: Diagnosis not present

## 2023-11-10 MED ORDER — GADOPICLENOL 0.5 MMOL/ML IV SOLN
9.0000 mL | Freq: Once | INTRAVENOUS | Status: AC | PRN
  Administered 2023-11-10: 9 mL via INTRAVENOUS

## 2024-01-06 DIAGNOSIS — M25561 Pain in right knee: Secondary | ICD-10-CM | POA: Diagnosis not present

## 2024-01-09 DIAGNOSIS — H43813 Vitreous degeneration, bilateral: Secondary | ICD-10-CM | POA: Diagnosis not present

## 2024-01-09 DIAGNOSIS — H04123 Dry eye syndrome of bilateral lacrimal glands: Secondary | ICD-10-CM | POA: Diagnosis not present

## 2024-01-09 DIAGNOSIS — H2513 Age-related nuclear cataract, bilateral: Secondary | ICD-10-CM | POA: Diagnosis not present

## 2024-01-09 DIAGNOSIS — H1045 Other chronic allergic conjunctivitis: Secondary | ICD-10-CM | POA: Diagnosis not present

## 2024-01-16 DIAGNOSIS — M1711 Unilateral primary osteoarthritis, right knee: Secondary | ICD-10-CM | POA: Diagnosis not present

## 2024-03-04 DIAGNOSIS — L570 Actinic keratosis: Secondary | ICD-10-CM | POA: Diagnosis not present

## 2024-03-04 DIAGNOSIS — X32XXXD Exposure to sunlight, subsequent encounter: Secondary | ICD-10-CM | POA: Diagnosis not present

## 2024-03-04 DIAGNOSIS — D225 Melanocytic nevi of trunk: Secondary | ICD-10-CM | POA: Diagnosis not present

## 2024-03-04 DIAGNOSIS — Z1283 Encounter for screening for malignant neoplasm of skin: Secondary | ICD-10-CM | POA: Diagnosis not present

## 2024-03-04 DIAGNOSIS — D485 Neoplasm of uncertain behavior of skin: Secondary | ICD-10-CM | POA: Diagnosis not present

## 2024-03-04 DIAGNOSIS — D2272 Melanocytic nevi of left lower limb, including hip: Secondary | ICD-10-CM | POA: Diagnosis not present

## 2024-04-16 DIAGNOSIS — M170 Bilateral primary osteoarthritis of knee: Secondary | ICD-10-CM | POA: Diagnosis not present

## 2024-04-22 DIAGNOSIS — M170 Bilateral primary osteoarthritis of knee: Secondary | ICD-10-CM | POA: Diagnosis not present
